# Patient Record
Sex: Female | Born: 1965 | Race: White | Hispanic: No | Marital: Married | Smoking: Never smoker
Health system: Southern US, Community
[De-identification: ages and names within clinical notes are randomized; demographics above are authoritative.]

## PROBLEM LIST (undated history)

## (undated) DIAGNOSIS — Z9189 Other specified personal risk factors, not elsewhere classified: Secondary | ICD-10-CM

## (undated) DIAGNOSIS — N183 Chronic kidney disease, stage 3 unspecified: Secondary | ICD-10-CM

## (undated) DIAGNOSIS — N393 Stress incontinence (female) (male): Secondary | ICD-10-CM

## (undated) DIAGNOSIS — F419 Anxiety disorder, unspecified: Secondary | ICD-10-CM

## (undated) DIAGNOSIS — Z1371 Encounter for nonprocreative screening for genetic disease carrier status: Secondary | ICD-10-CM

## (undated) DIAGNOSIS — E559 Vitamin D deficiency, unspecified: Secondary | ICD-10-CM

## (undated) DIAGNOSIS — N979 Female infertility, unspecified: Secondary | ICD-10-CM

## (undated) DIAGNOSIS — L309 Dermatitis, unspecified: Secondary | ICD-10-CM

## (undated) DIAGNOSIS — A6000 Herpesviral infection of urogenital system, unspecified: Secondary | ICD-10-CM

## (undated) DIAGNOSIS — F329 Major depressive disorder, single episode, unspecified: Secondary | ICD-10-CM

## (undated) DIAGNOSIS — Z803 Family history of malignant neoplasm of breast: Secondary | ICD-10-CM

## (undated) DIAGNOSIS — F3281 Premenstrual dysphoric disorder: Secondary | ICD-10-CM

## (undated) DIAGNOSIS — F32A Depression, unspecified: Secondary | ICD-10-CM

## (undated) DIAGNOSIS — B009 Herpesviral infection, unspecified: Secondary | ICD-10-CM

## (undated) HISTORY — DX: Vitamin D deficiency, unspecified: E55.9

## (undated) HISTORY — DX: Herpesviral infection of urogenital system, unspecified: A60.00

## (undated) HISTORY — DX: Anxiety disorder, unspecified: F41.9

## (undated) HISTORY — DX: Chronic kidney disease, stage 3 unspecified: N18.30

## (undated) HISTORY — DX: Encounter for nonprocreative screening for genetic disease carrier status: Z13.71

## (undated) HISTORY — DX: Major depressive disorder, single episode, unspecified: F32.9

## (undated) HISTORY — DX: Dermatitis, unspecified: L30.9

## (undated) HISTORY — PX: DILATION AND CURETTAGE OF UTERUS: SHX78

## (undated) HISTORY — DX: Female infertility, unspecified: N97.9

## (undated) HISTORY — DX: Premenstrual dysphoric disorder: F32.81

## (undated) HISTORY — DX: Depression, unspecified: F32.A

## (undated) HISTORY — DX: Family history of malignant neoplasm of breast: Z80.3

## (undated) HISTORY — DX: Herpesviral infection, unspecified: B00.9

## (undated) HISTORY — DX: Stress incontinence (female) (male): N39.3

## (undated) HISTORY — DX: Other specified personal risk factors, not elsewhere classified: Z91.89

---

## 2001-11-28 ENCOUNTER — Encounter: Admission: RE | Admit: 2001-11-28 | Discharge: 2001-11-28 | Payer: Self-pay | Admitting: Internal Medicine

## 2001-11-28 ENCOUNTER — Encounter: Payer: Self-pay | Admitting: Internal Medicine

## 2005-08-23 ENCOUNTER — Encounter: Admission: RE | Admit: 2005-08-23 | Discharge: 2005-08-23 | Payer: Self-pay | Admitting: Family Medicine

## 2005-10-04 ENCOUNTER — Encounter: Admission: RE | Admit: 2005-10-04 | Discharge: 2005-10-04 | Payer: Self-pay | Admitting: Unknown Physician Specialty

## 2005-11-23 ENCOUNTER — Encounter: Admission: RE | Admit: 2005-11-23 | Discharge: 2005-11-23 | Payer: Self-pay | Admitting: Unknown Physician Specialty

## 2006-05-13 ENCOUNTER — Encounter: Admission: RE | Admit: 2006-05-13 | Discharge: 2006-05-13 | Payer: Self-pay | Admitting: Unknown Physician Specialty

## 2006-10-28 ENCOUNTER — Encounter: Admission: RE | Admit: 2006-10-28 | Discharge: 2006-10-28 | Payer: Self-pay | Admitting: Unknown Physician Specialty

## 2007-12-10 ENCOUNTER — Encounter: Admission: RE | Admit: 2007-12-10 | Discharge: 2007-12-10 | Payer: Self-pay | Admitting: Unknown Physician Specialty

## 2009-02-25 ENCOUNTER — Encounter: Admission: RE | Admit: 2009-02-25 | Discharge: 2009-02-25 | Payer: Self-pay | Admitting: Unknown Physician Specialty

## 2010-02-23 ENCOUNTER — Encounter: Admission: RE | Admit: 2010-02-23 | Discharge: 2010-02-23 | Payer: Self-pay | Admitting: Family Medicine

## 2010-05-15 ENCOUNTER — Encounter: Admission: RE | Admit: 2010-05-15 | Discharge: 2010-05-15 | Payer: Self-pay | Admitting: Unknown Physician Specialty

## 2011-06-04 ENCOUNTER — Other Ambulatory Visit: Payer: Self-pay | Admitting: Unknown Physician Specialty

## 2011-06-04 DIAGNOSIS — Z1231 Encounter for screening mammogram for malignant neoplasm of breast: Secondary | ICD-10-CM

## 2011-06-25 ENCOUNTER — Ambulatory Visit
Admission: RE | Admit: 2011-06-25 | Discharge: 2011-06-25 | Disposition: A | Discharge status: Home / Self Care | Payer: BC Managed Care – PPO | Source: Ambulatory Visit | Attending: Unknown Physician Specialty | Admitting: Unknown Physician Specialty

## 2011-06-25 DIAGNOSIS — Z1231 Encounter for screening mammogram for malignant neoplasm of breast: Secondary | ICD-10-CM

## 2012-06-18 ENCOUNTER — Other Ambulatory Visit: Payer: Self-pay | Admitting: Unknown Physician Specialty

## 2012-06-18 DIAGNOSIS — Z1231 Encounter for screening mammogram for malignant neoplasm of breast: Secondary | ICD-10-CM

## 2012-07-28 ENCOUNTER — Ambulatory Visit
Admission: RE | Admit: 2012-07-28 | Discharge: 2012-07-28 | Disposition: A | Discharge status: Home / Self Care | Payer: BC Managed Care – PPO | Source: Ambulatory Visit | Attending: Unknown Physician Specialty | Admitting: Unknown Physician Specialty

## 2012-07-28 DIAGNOSIS — Z1231 Encounter for screening mammogram for malignant neoplasm of breast: Secondary | ICD-10-CM

## 2012-10-20 ENCOUNTER — Other Ambulatory Visit: Payer: Self-pay | Admitting: *Deleted

## 2012-10-20 DIAGNOSIS — Z1239 Encounter for other screening for malignant neoplasm of breast: Secondary | ICD-10-CM

## 2012-10-21 ENCOUNTER — Ambulatory Visit: Payer: Self-pay | Admitting: Oncology

## 2012-11-18 ENCOUNTER — Telehealth: Payer: Self-pay | Admitting: Oncology

## 2012-11-18 NOTE — Telephone Encounter (Signed)
/  S/w pt in re to [facility] appt 07/16 @ 11 w/Dr. Welton Flakes.

## 2012-12-19 ENCOUNTER — Ambulatory Visit
Admission: RE | Admit: 2012-12-19 | Discharge: 2012-12-19 | Disposition: A | Discharge status: Home / Self Care | Payer: BC Managed Care – PPO | Source: Ambulatory Visit | Attending: *Deleted | Admitting: *Deleted

## 2012-12-19 DIAGNOSIS — Z1239 Encounter for other screening for malignant neoplasm of breast: Secondary | ICD-10-CM

## 2012-12-19 MED ORDER — GADOBENATE DIMEGLUMINE 529 MG/ML IV SOLN
13.0000 mL | Freq: Once | INTRAVENOUS | Status: AC | PRN
  Administered 2012-12-19: 13 mL via INTRAVENOUS

## 2013-02-11 ENCOUNTER — Encounter: Payer: BC Managed Care – PPO | Admitting: Oncology

## 2013-05-07 ENCOUNTER — Other Ambulatory Visit: Payer: Self-pay | Admitting: Internal Medicine

## 2013-05-07 DIAGNOSIS — E079 Disorder of thyroid, unspecified: Secondary | ICD-10-CM

## 2013-05-13 ENCOUNTER — Ambulatory Visit
Admission: RE | Admit: 2013-05-13 | Discharge: 2013-05-13 | Disposition: A | Discharge status: Home / Self Care | Payer: BC Managed Care – PPO | Source: Ambulatory Visit | Attending: Internal Medicine | Admitting: Internal Medicine

## 2013-05-13 DIAGNOSIS — E079 Disorder of thyroid, unspecified: Secondary | ICD-10-CM

## 2013-08-12 ENCOUNTER — Other Ambulatory Visit: Payer: Self-pay

## 2013-08-12 DIAGNOSIS — Z1231 Encounter for screening mammogram for malignant neoplasm of breast: Secondary | ICD-10-CM

## 2013-09-04 ENCOUNTER — Ambulatory Visit
Admission: RE | Admit: 2013-09-04 | Discharge: 2013-09-04 | Disposition: A | Discharge status: Home / Self Care | Payer: Self-pay | Source: Ambulatory Visit

## 2013-09-04 DIAGNOSIS — Z1231 Encounter for screening mammogram for malignant neoplasm of breast: Secondary | ICD-10-CM

## 2016-05-18 ENCOUNTER — Other Ambulatory Visit: Payer: Self-pay | Admitting: Obstetrics and Gynecology

## 2016-05-18 DIAGNOSIS — N63 Unspecified lump in unspecified breast: Secondary | ICD-10-CM

## 2016-05-24 ENCOUNTER — Ambulatory Visit
Admission: RE | Admit: 2016-05-24 | Discharge: 2016-05-24 | Disposition: A | Discharge status: Home / Self Care | Payer: Self-pay | Source: Ambulatory Visit | Attending: Obstetrics and Gynecology | Admitting: Obstetrics and Gynecology

## 2016-05-24 ENCOUNTER — Other Ambulatory Visit: Payer: Self-pay

## 2016-05-24 DIAGNOSIS — N63 Unspecified lump in unspecified breast: Secondary | ICD-10-CM

## 2016-06-07 ENCOUNTER — Encounter: Payer: Self-pay | Admitting: Gastroenterology

## 2016-06-29 DIAGNOSIS — Z1371 Encounter for nonprocreative screening for genetic disease carrier status: Secondary | ICD-10-CM

## 2016-06-29 DIAGNOSIS — Z9189 Other specified personal risk factors, not elsewhere classified: Secondary | ICD-10-CM

## 2016-06-29 HISTORY — DX: Encounter for nonprocreative screening for genetic disease carrier status: Z13.71

## 2016-06-29 HISTORY — DX: Other specified personal risk factors, not elsewhere classified: Z91.89

## 2016-07-12 ENCOUNTER — Ambulatory Visit (AMBULATORY_SURGERY_CENTER): Payer: Self-pay | Admitting: *Deleted

## 2016-07-12 VITALS — Ht 63.0 in | Wt 162.0 lb

## 2016-07-12 DIAGNOSIS — Z1211 Encounter for screening for malignant neoplasm of colon: Secondary | ICD-10-CM

## 2016-07-12 MED ORDER — NA SULFATE-K SULFATE-MG SULF 17.5-3.13-1.6 GM/177ML PO SOLN: 1.0000 | Freq: Once | ORAL | 0 refills | Status: AC

## 2016-07-12 NOTE — Progress Notes (Signed)
No egg or soy allergy known to patient  No issues with past sedation with any surgeries  or procedures, no intubation problems  No diet pills per patient No home 02 use per patient  No blood thinners per patient  Pt denies issues with constipation  No A fib or A flutter   emmi declined'   

## 2016-07-13 ENCOUNTER — Encounter: Payer: Self-pay | Admitting: Gastroenterology

## 2016-07-25 ENCOUNTER — Encounter: Payer: 59 | Admitting: Gastroenterology

## 2016-08-22 DIAGNOSIS — D225 Melanocytic nevi of trunk: Secondary | ICD-10-CM | POA: Diagnosis not present

## 2016-08-22 DIAGNOSIS — L821 Other seborrheic keratosis: Secondary | ICD-10-CM | POA: Diagnosis not present

## 2016-08-22 DIAGNOSIS — D2371 Other benign neoplasm of skin of right lower limb, including hip: Secondary | ICD-10-CM | POA: Diagnosis not present

## 2016-09-03 ENCOUNTER — Ambulatory Visit (AMBULATORY_SURGERY_CENTER): Payer: 59 | Admitting: Gastroenterology

## 2016-09-03 ENCOUNTER — Encounter: Payer: Self-pay | Admitting: Gastroenterology

## 2016-09-03 VITALS — BP 108/64 | HR 64 | Temp 98.9°F | Resp 16 | Ht 63.0 in | Wt 162.0 lb

## 2016-09-03 DIAGNOSIS — Z1211 Encounter for screening for malignant neoplasm of colon: Secondary | ICD-10-CM

## 2016-09-03 DIAGNOSIS — K635 Polyp of colon: Secondary | ICD-10-CM

## 2016-09-03 DIAGNOSIS — D120 Benign neoplasm of cecum: Secondary | ICD-10-CM

## 2016-09-03 DIAGNOSIS — Z1212 Encounter for screening for malignant neoplasm of rectum: Secondary | ICD-10-CM | POA: Diagnosis not present

## 2016-09-03 MED ORDER — SODIUM CHLORIDE 0.9 % IV SOLN: 500.0000 mL | INTRAVENOUS | Status: AC

## 2016-09-03 NOTE — Progress Notes (Signed)
Called to room to assist during endoscopic procedure.  Patient ID and intended procedure confirmed with present staff. Received instructions for my participation in the procedure from the performing physician.  

## 2016-09-03 NOTE — Progress Notes (Signed)
Report to PACU, RN, vss, BBS= Clear.  

## 2016-09-03 NOTE — Patient Instructions (Signed)
YOU HAD AN ENDOSCOPIC PROCEDURE TODAY AT [facility]:   Refer to the procedure report that was given to you for any specific questions about what was found during the examination.  If the procedure report does not answer your questions, please call your gastroenterologist to clarify.  If you requested that your care partner not be given the details of your procedure findings, then the procedure report has been included in a sealed envelope for you to review at your convenience later.  YOU SHOULD EXPECT: Some feelings of bloating in the abdomen. Passage of more gas than usual.  Walking can help get rid of the air that was put into your GI tract during the procedure and reduce the bloating. If you had a lower endoscopy (such as a colonoscopy or flexible sigmoidoscopy) you may notice spotting of blood in your stool or on the toilet paper. If you underwent a bowel prep for your procedure, you may not have a normal bowel movement for a few days.  Please Note:  You might notice some irritation and congestion in your nose or some drainage.  This is from the oxygen used during your procedure.  There is no need for concern and it should clear up in a day or so.  SYMPTOMS TO REPORT IMMEDIATELY:   Following lower endoscopy (colonoscopy or flexible sigmoidoscopy):  Excessive amounts of blood in the stool  Significant tenderness or worsening of abdominal pains  Swelling of the abdomen that is new, acute  Fever of 100F or higher  For urgent or emergent issues, a gastroenterologist can be reached at any hour by calling [phone].   DIET:  We do recommend a small meal at first, but then you may proceed to your regular diet.  Drink plenty of fluids but you should avoid alcoholic beverages for 24 hours.  ACTIVITY:  You should plan to take it easy for the rest of today and you should NOT DRIVE or use heavy machinery until tomorrow (because of the sedation medicines used during the test).     FOLLOW UP: Our staff will call the number listed on your records the next business day following your procedure to check on you and address any questions or concerns that you may have regarding the information given to you following your procedure. If we do not reach you, we will leave a message.  However, if you are feeling well and you are not experiencing any problems, there is no need to return our call.  We will assume that you have returned to your regular daily activities without incident.  If any biopsies were taken you will be contacted by phone or by letter within the next 1-3 weeks.  Please call us at [phone] if you have not heard about the biopsies in 3 weeks.   Await biopsy results to determined next repeat Colonoscopy( 5-10 years repeat Colonoscopy depending on biopsy results) Polyps (handout given)   SIGNATURES/CONFIDENTIALITY: You and/or your care partner have signed paperwork which will be entered into your electronic medical record.  These signatures attest to the fact that that the information above on your After Visit Summary has been reviewed and is understood.  Full responsibility of the confidentiality of this discharge information lies with you and/or your care-partner.

## 2016-09-03 NOTE — Progress Notes (Signed)
Pt's states no medical or surgical changes since previsit or office visit. 

## 2016-09-03 NOTE — Op Note (Signed)
[facility] Patient Name: Betty Ross Procedure Date: 09/03/2016 9:37 AM MRN: [id number] Endoscopist: Mauri Pole , MD Age: 51 Referring MD:  Date of Birth: 1966-04-18 Gender: Female Account #: [id number] Procedure:                Colonoscopy Indications:              Screening for colorectal malignant neoplasm Medicines:                Monitored Anesthesia Care Procedure:                Pre-Anesthesia Assessment:                           - Prior to the procedure, a History and Physical                            was performed, and patient medications and                            allergies were reviewed. The patient's tolerance of                            previous anesthesia was also reviewed. The risks                            and benefits of the procedure and the sedation                            options and risks were discussed with the patient.                            All questions were answered, and informed consent                            was obtained. Prior Anticoagulants: The patient has                            taken no previous anticoagulant or antiplatelet                            agents. ASA Grade Assessment: II - A patient with                            mild systemic disease. After reviewing the risks                            and benefits, the patient was deemed in                            satisfactory condition to undergo the procedure.                           After obtaining informed consent, the colonoscope  was passed under direct vision. Throughout the                            procedure, the patient's blood pressure, pulse, and                            oxygen saturations were monitored continuously. The                            Model CF-HQ190L [id number]) scope was introduced                            through the anus and advanced to the the terminal                            ileum, with  identification of the appendiceal                            orifice and IC valve. The colonoscopy was performed                            without difficulty. The patient tolerated the                            procedure well. The quality of the bowel                            preparation was good. The terminal ileum, ileocecal                            valve, appendiceal orifice, and rectum were                            photographed. Scope In: 9:40:04 AM Scope Out: 9:55:09 AM Scope Withdrawal Time: 0 hours 11 minutes 36 seconds  Total Procedure Duration: 0 hours 15 minutes 5 seconds  Findings:                 The perianal and digital rectal examinations were                            normal.                           A 2 mm polyp was found in the cecum. The polyp was                            sessile. The polyp was removed with a cold biopsy                            forceps. Resection and retrieval were complete.                           The exam was otherwise without abnormality. Complications:  No immediate complications. Estimated Blood Loss:     Estimated blood loss was minimal. Impression:               - One 2 mm polyp in the cecum, removed with a cold                            biopsy forceps. Resected and retrieved.                           - The examination was otherwise normal. Recommendation:           - Patient has a contact number available for                            emergencies. The signs and symptoms of potential                            delayed complications were discussed with the                            patient. Return to normal activities tomorrow.                            Written discharge instructions were provided to the                            patient.                           - Resume previous diet.                           - Continue present medications.                           - Await pathology results.                            - Repeat colonoscopy in 5-10 years for surveillance                            based on pathology results. Mauri Pole, MD 09/03/2016 10:02:29 AM This report has been signed electronically.

## 2016-09-04 ENCOUNTER — Telehealth: Payer: Self-pay

## 2016-09-04 NOTE — Telephone Encounter (Signed)
  Follow up Call-  Call back number 09/03/2016  Post procedure Call Back phone  # [phone]  Permission to leave phone message Yes  Some recent data might be hidden     Patient questions:  Do you have a fever, pain , or abdominal swelling? No. Pain Score  0 *  Have you tolerated food without any problems? Yes.    Have you been able to return to your normal activities? Yes.    Do you have any questions about your discharge instructions: Diet   No. Medications  No. Follow up visit  No.  Do you have questions or concerns about your Care? No.  Actions: * If pain score is 4 or above: No action needed, pain <4.

## 2016-09-21 ENCOUNTER — Encounter: Payer: Self-pay | Admitting: Gastroenterology

## 2016-11-28 ENCOUNTER — Telehealth: Payer: Self-pay

## 2016-11-28 DIAGNOSIS — A6009 Herpesviral infection of other urogenital tract: Secondary | ICD-10-CM

## 2016-11-28 MED ORDER — VALACYCLOVIR HCL 500 MG PO TABS: 500.0000 mg | ORAL_TABLET | Freq: Two times a day (BID) | ORAL | 5 refills | Status: DC

## 2016-11-28 NOTE — Telephone Encounter (Signed)
   This encounter was created in error - please disregard.  Rx called in to pharm.  Pt aware.

## 2016-11-28 NOTE — Telephone Encounter (Signed)
This encounter was created in error - please disregard.

## 2016-11-28 NOTE — Telephone Encounter (Signed)
Pt calling.  She contacted her [facility] in [place], - for refill but it was too old to refill.  She thinks she has had an annula this year.  Needs refill for valtrex.  [phone]

## 2016-11-28 NOTE — Telephone Encounter (Signed)
Pt called for refill of valtrex.  Refill called in as I wasn't able to get order to go thru [facility].

## 2016-12-05 ENCOUNTER — Telehealth: Payer: Self-pay

## 2016-12-05 NOTE — Telephone Encounter (Signed)
Pt left msg stating that rx for valtrex had not been sent in. Per last telephone note rx called in via phone to [facility] on [address]. Called pharmacy to verify they received and rx was put on hold. Left msg for pt to contact pharmacy to have them fill rx.

## 2017-03-19 DIAGNOSIS — M25511 Pain in right shoulder: Secondary | ICD-10-CM | POA: Diagnosis not present

## 2017-03-19 DIAGNOSIS — M7541 Impingement syndrome of right shoulder: Secondary | ICD-10-CM | POA: Diagnosis not present

## 2017-03-30 ENCOUNTER — Other Ambulatory Visit: Payer: Self-pay | Admitting: Obstetrics and Gynecology

## 2017-03-30 DIAGNOSIS — A6009 Herpesviral infection of other urogenital tract: Secondary | ICD-10-CM

## 2017-04-25 ENCOUNTER — Ambulatory Visit (INDEPENDENT_AMBULATORY_CARE_PROVIDER_SITE_OTHER): Payer: 59 | Admitting: Obstetrics and Gynecology

## 2017-04-25 ENCOUNTER — Encounter: Payer: Self-pay | Admitting: Obstetrics and Gynecology

## 2017-04-25 VITALS — BP 120/80 | HR 58 | Ht 63.0 in | Wt 149.0 lb

## 2017-04-25 DIAGNOSIS — Z9189 Other specified personal risk factors, not elsewhere classified: Secondary | ICD-10-CM

## 2017-04-25 DIAGNOSIS — Z803 Family history of malignant neoplasm of breast: Secondary | ICD-10-CM | POA: Diagnosis not present

## 2017-04-25 DIAGNOSIS — N760 Acute vaginitis: Secondary | ICD-10-CM | POA: Diagnosis not present

## 2017-04-25 LAB — POCT WET PREP WITH KOH
Clue Cells Wet Prep HPF POC: NEGATIVE
KOH PREP POC: NEGATIVE
TRICHOMONAS UA: NEGATIVE
Yeast Wet Prep HPF POC: NEGATIVE

## 2017-04-25 MED ORDER — CLOTRIMAZOLE-BETAMETHASONE 1-0.05 % EX CREA: 1.0000 "application " | TOPICAL_CREAM | Freq: Two times a day (BID) | CUTANEOUS | 0 refills | Status: DC

## 2017-04-25 NOTE — Progress Notes (Signed)
Chief Complaint  Patient presents with  . Vaginitis    HPI:      [name] is a 51 y.o. [id number] who LMP was Patient's last menstrual period was 03/30/2017., presents today for vaginal itching/irritation and cuts for the fast few months. She has  had the same sx that come and go, usually before her periods, for a few yrs, but sx are more frequent recently. She has treated with valtrex with relief in the past but that isn't working anymore. She has an increased d/c, no odor. She uses dove soap/no dryer sheets/unscented pantyliners. No ext meds to treat.   She is also past due for [facility] testing results done 12/17 at annual.  Results are negative for a cancer genetic mutation, EXCEPT 2 VUS in MSH6 and RAD51C.  IBIS=33.8 % Riskscore=47.4 % Last mammogram 10/17 Vitamin D status: TAKES 3000 IU daily  Annual due 12/18.   Past Medical History:  Diagnosis Date  . BRCA negative 06/2016   MyRisk neg, MSH6 and RAD51C VUS  . Depression   . Family history of breast cancer   . Increased risk of breast cancer 06/2016   IBIS=33%/riskscore=47.4%  . Vitamin D deficiency     Past Surgical History:  Procedure Laterality Date  . DILATION AND CURETTAGE OF UTERUS    . VAGINAL DELIVERY     x2    Family History  Problem Relation Age of Onset  . Colon polyps Father   . Kidney cancer Father 86  . Breast cancer Mother 5  . Breast cancer Sister 24       mat 1/2 sister, ? BRCA neg  . Breast cancer Paternal Aunt 45  . Colon cancer Neg Hx   . Esophageal cancer Neg Hx   . Rectal cancer Neg Hx   . Stomach cancer Neg Hx     Social History   Social History  . Marital status: Married    Spouse name: N/A  . Number of children: N/A  . Years of education: N/A   Occupational History  . Not on file.   Social History Main Topics  . Smoking status: Never Smoker  . Smokeless tobacco: Never Used  . Alcohol use No  . Drug use: No  . Sexual activity: Yes    Birth control/ protection:  None   Other Topics Concern  . Not on file   Social History Narrative  . No narrative on file     Current Outpatient Prescriptions:  .  buPROPion (WELLBUTRIN SR) 200 MG 12 hr tablet, Take 200 mg by mouth daily., Disp: , Rfl:  .  cholecalciferol (VITAMIN D) 1000 units tablet, Take 1,000 Units by mouth daily., Disp: , Rfl:  .  clotrimazole-betamethasone (LOTRISONE) cream, Apply 1 application topically 2 (two) times daily. Apply externally BID prn sx up to 2 wks, Disp: 15 g, Rfl: 0 .  valACYclovir (VALTREX) 500 MG tablet, TAKE 1 TABLET BY MOUTH TWICE DAILY FOR 3 DAYS AS NEEDED FOR SYMPTOMS, Disp: 30 tablet, Rfl: 0  Current Facility-Administered Medications:  .  0.9 %  sodium chloride infusion, 500 mL, Intravenous, Continuous, Nandigam, Kavitha V, MD   ROS:  Review of Systems  Constitutional: Negative for fever.  Gastrointestinal: Negative for blood in stool, constipation, diarrhea, nausea and vomiting.  Genitourinary: Positive for vaginal discharge. Negative for dyspareunia, dysuria, flank pain, frequency, hematuria, urgency, vaginal bleeding and vaginal pain.  Musculoskeletal: Negative for back pain.  Skin: Negative for rash.  OBJECTIVE:   Vitals:  BP 120/80   Pulse (!) 58   Ht '5\' 3"'  (1.6 m)   Wt 149 lb (67.6 kg)   LMP 03/30/2017   BMI 26.39 kg/m   Physical Exam  Constitutional: She is oriented to person, place, and time and well-developed, well-nourished, and in no distress. Vital signs are normal.  Genitourinary: Vagina normal, uterus normal, cervix normal, right adnexa normal and left adnexa normal. Uterus is not enlarged. Cervix exhibits no motion tenderness and no tenderness. Right adnexum displays no mass and no tenderness. Left adnexum displays no mass and no tenderness. Vulva exhibits erythema. Vulva exhibits no exudate, no lesion, no rash and no tenderness. Vagina exhibits no lesion.  Genitourinary Comments: EXT LABIA MAJORA WITH BILAT ERYTHEMA/SCALE; FEW  FISSURES RT LABIA MINORA/MAJORA JXN; NO ULCERATIVE LESIONS  Neurological: She is oriented to person, place, and time.  Vitals reviewed.   Results: Results for orders placed or performed in visit on 04/25/17 (from the past 24 hour(s))  POCT Wet Prep with KOH     Status: Normal   Collection Time: 04/25/17 12:01 PM  Result Value Ref Range   Trichomonas, UA Negative    Clue Cells Wet Prep HPF POC NEG    Epithelial Wet Prep HPF POC  Few, Moderate, Many, Too numerous to count   Yeast Wet Prep HPF POC NEG    Bacteria Wet Prep HPF POC  Few   RBC Wet Prep HPF POC     WBC Wet Prep HPF POC     KOH Prep POC Negative Negative     Assessment/Plan: Acute vaginitis - Neg wet prep/pos exam. Rx lotrisone crm. F/u prn.  - Plan: clotrimazole-betamethasone (LOTRISONE) cream, POCT Wet Prep with KOH  Family history of breast cancer - MyRisk neg.   Increased risk of breast cancer - Riskscore=47%.  Recommended monthly SBE, Q6-12 mos CBE, yearly mammos and add Vit D3 3000 IU dialy. Also offered yearly screening breast MRI, staggered with mammos. Pt to do mammo first and f/u by 4/19 if wants MRI.   Patient understands these results only apply to her and her children, and this is not indicative of genetic testing results of her other family members. It is recommended that her other family members have genetic testing done.  Pt also understands negative genetic testing doesn't mean she will never get any of these cancers.   Results handout given to pt.    Meds ordered this encounter  Medications  . clotrimazole-betamethasone (LOTRISONE) cream    Sig: Apply 1 application topically 2 (two) times daily. Apply externally BID prn sx up to 2 wks    Dispense:  15 g    Refill:  0      Return if symptoms worsen or fail to improve, for 11/03 annual.  Rondey Fallen B. Corben Auzenne, PA-C 04/25/2017 12:09 PM

## 2017-05-03 DIAGNOSIS — Z23 Encounter for immunization: Secondary | ICD-10-CM | POA: Diagnosis not present

## 2017-06-04 DIAGNOSIS — R82998 Other abnormal findings in urine: Secondary | ICD-10-CM | POA: Diagnosis not present

## 2017-06-04 DIAGNOSIS — Z0000 Encounter for general adult medical examination without abnormal findings: Secondary | ICD-10-CM | POA: Diagnosis not present

## 2017-06-11 ENCOUNTER — Other Ambulatory Visit: Payer: Self-pay | Admitting: Obstetrics and Gynecology

## 2017-06-11 DIAGNOSIS — Z803 Family history of malignant neoplasm of breast: Secondary | ICD-10-CM | POA: Diagnosis not present

## 2017-06-11 DIAGNOSIS — Z0000 Encounter for general adult medical examination without abnormal findings: Secondary | ICD-10-CM | POA: Diagnosis not present

## 2017-06-11 DIAGNOSIS — E559 Vitamin D deficiency, unspecified: Secondary | ICD-10-CM | POA: Diagnosis not present

## 2017-06-11 DIAGNOSIS — Z1231 Encounter for screening mammogram for malignant neoplasm of breast: Secondary | ICD-10-CM

## 2017-06-13 DIAGNOSIS — Z1212 Encounter for screening for malignant neoplasm of rectum: Secondary | ICD-10-CM | POA: Diagnosis not present

## 2017-07-10 ENCOUNTER — Ambulatory Visit: Payer: 59

## 2017-07-11 ENCOUNTER — Encounter: Payer: Self-pay | Admitting: Obstetrics and Gynecology

## 2017-07-11 ENCOUNTER — Ambulatory Visit
Admission: RE | Admit: 2017-07-11 | Discharge: 2017-07-11 | Disposition: A | Discharge status: Home / Self Care | Payer: 59 | Source: Ambulatory Visit | Attending: Obstetrics and Gynecology | Admitting: Obstetrics and Gynecology

## 2017-07-11 DIAGNOSIS — Z1231 Encounter for screening mammogram for malignant neoplasm of breast: Secondary | ICD-10-CM | POA: Diagnosis not present

## 2017-08-22 DIAGNOSIS — D2272 Melanocytic nevi of left lower limb, including hip: Secondary | ICD-10-CM | POA: Diagnosis not present

## 2017-08-22 DIAGNOSIS — D2271 Melanocytic nevi of right lower limb, including hip: Secondary | ICD-10-CM | POA: Diagnosis not present

## 2017-08-22 DIAGNOSIS — L570 Actinic keratosis: Secondary | ICD-10-CM | POA: Diagnosis not present

## 2017-08-22 DIAGNOSIS — D225 Melanocytic nevi of trunk: Secondary | ICD-10-CM | POA: Diagnosis not present

## 2017-10-21 ENCOUNTER — Ambulatory Visit (INDEPENDENT_AMBULATORY_CARE_PROVIDER_SITE_OTHER): Payer: 59 | Admitting: Obstetrics and Gynecology

## 2017-10-21 ENCOUNTER — Encounter: Payer: Self-pay | Admitting: Obstetrics and Gynecology

## 2017-10-21 VITALS — BP 112/68 | HR 70 | Ht 63.0 in | Wt 150.0 lb

## 2017-10-21 DIAGNOSIS — F329 Major depressive disorder, single episode, unspecified: Secondary | ICD-10-CM

## 2017-10-21 DIAGNOSIS — Z803 Family history of malignant neoplasm of breast: Secondary | ICD-10-CM | POA: Diagnosis not present

## 2017-10-21 DIAGNOSIS — F32A Depression, unspecified: Secondary | ICD-10-CM

## 2017-10-21 DIAGNOSIS — F3281 Premenstrual dysphoric disorder: Secondary | ICD-10-CM | POA: Insufficient documentation

## 2017-10-21 DIAGNOSIS — Z01419 Encounter for gynecological examination (general) (routine) without abnormal findings: Secondary | ICD-10-CM

## 2017-10-21 DIAGNOSIS — Z1239 Encounter for other screening for malignant neoplasm of breast: Secondary | ICD-10-CM

## 2017-10-21 DIAGNOSIS — Z9189 Other specified personal risk factors, not elsewhere classified: Secondary | ICD-10-CM

## 2017-10-21 DIAGNOSIS — Z1231 Encounter for screening mammogram for malignant neoplasm of breast: Secondary | ICD-10-CM

## 2017-10-21 DIAGNOSIS — A6004 Herpesviral vulvovaginitis: Secondary | ICD-10-CM | POA: Diagnosis not present

## 2017-10-21 DIAGNOSIS — Z1151 Encounter for screening for human papillomavirus (HPV): Secondary | ICD-10-CM | POA: Diagnosis not present

## 2017-10-21 DIAGNOSIS — N393 Stress incontinence (female) (male): Secondary | ICD-10-CM | POA: Insufficient documentation

## 2017-10-21 DIAGNOSIS — Z124 Encounter for screening for malignant neoplasm of cervix: Secondary | ICD-10-CM | POA: Diagnosis not present

## 2017-10-21 DIAGNOSIS — F419 Anxiety disorder, unspecified: Secondary | ICD-10-CM

## 2017-10-21 DIAGNOSIS — B009 Herpesviral infection, unspecified: Secondary | ICD-10-CM | POA: Insufficient documentation

## 2017-10-21 MED ORDER — SERTRALINE HCL 50 MG PO TABS: 50.0000 mg | ORAL_TABLET | Freq: Every day | ORAL | 1 refills | Status: DC

## 2017-10-21 NOTE — Progress Notes (Signed)
PCP: [name], MD   Chief Complaint  Patient presents with  . Gynecologic Exam    HPI:      [name] is a 52 y.o. [id number] who LMP was No LMP recorded. Patient is perimenopausal., presents today for her annual examination.  Her menses are irregular with perimenopause, lasting 3 days.  Dysmenorrhea none. She does not have intermenstrual bleeding. She does have vasomotor sx, especially night sweats. Sx worse if under stress.   Sex activity: single partner, contraception - vasectomy. She does not have vaginal dryness.  Last Pap: March 01, 2015  Results were: no abnormalities /neg HPV DNA.  Hx of STDs: HSV, takes valtrex prn sx; doesn't need Rx RF currently  Last mammogram: July 11, 2017  Results were: normal--routine follow-up in 12 months There is a FH of breast cancer in her mom, mat 1/2 sister and pat aunt. There is no FH of ovarian cancer. The patient does do self-breast exams. Pt is MyRisk neg 2017. IBIS=33%/riskscore=47%. She has not had a scr breast MRI. She takes Vit D supp. She had a Q6 mo CBE 9/18.  Colonoscopy: colonoscopy 1 years ago without abnormalities.  Repeat due after 10 years.   Tobacco use: The patient denies current or previous tobacco use. Alcohol use: none Exercise: very active  She does get adequate calcium and Vitamin D in her diet.  Labs with PCP.  She is on wellbutrin for depression sx with PCP. She has noted increased anxiety sx of feeling overwhelmed, difficulty sleeping, not being able to relax, difficulty concentrating. 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 Sx are at least once daily. Has not tried any meds. She exercises daily to help.   Past Medical History:  Diagnosis Date  . Anxiety   . BRCA negative 06/2016   MyRisk neg, MSH6 and RAD51C VUS  . Depression   . Eczema   . Family history of breast cancer   . Herpes   . Increased risk of breast cancer 06/2016   IBIS=33%/riskscore=47.4%  . Infertility, female   . PMDD (premenstrual dysphoric disorder)   . Stress incontinence   . Vitamin D deficiency     Past Surgical History:  Procedure Laterality Date  . DILATION AND CURETTAGE OF UTERUS    . VAGINAL DELIVERY     x2    Family History  Problem Relation Age of Onset  . Colon polyps Father   . Kidney cancer Father 54  . Breast cancer Mother 37  . Osteoporosis Mother   . Breast cancer Sister 5       mat 1/2 sister, ? BRCA neg  . Breast cancer Paternal Aunt 86  . Colon cancer Neg Hx   . Esophageal cancer Neg Hx   . Rectal cancer Neg Hx   . Stomach cancer Neg Hx     Social History   Socioeconomic History  . Marital status: Married    Spouse name: Not on file  . Number of children: Not on file  . Years of education: Not on file  . Highest education level: Not on file  Occupational History  . Not on file  Social Needs  . Financial resource strain: Not on file  . Food insecurity:    Worry: Not on file    Inability: Not on file  . Transportation needs:    Medical: Not on file    Non-medical: Not on file  Tobacco Use  . Smoking status: Never Smoker  . Smokeless tobacco: Never Used  Substance and Sexual Activity  . Alcohol use: No  . Drug use: No  . Sexual activity: Yes    Birth control/protection: None  Lifestyle  . Physical activity:    Days per week: Not on file    Minutes per session: Not on file  . Stress: Not on file  Relationships  . Social connections:    Talks on phone: Not on file    Gets together: Not on file    Attends religious service: Not on file    Active member of club or organization: Not on file    Attends meetings of clubs or organizations: Not on file    Relationship status: Not on file  . Intimate partner violence:    Fear of current or ex partner: Not on file    Emotionally abused: Not on file    Physically abused: Not on file    Forced sexual activity: Not on file  Other Topics Concern  .  Not on file  Social History Narrative  . Not on file    Outpatient Medications Prior to Visit  Medication Sig Dispense Refill  . buPROPion (WELLBUTRIN SR) 200 MG 12 hr tablet Take 200 mg by mouth daily.    . cholecalciferol (VITAMIN D) 1000 units tablet Take 1,000 Units by mouth daily.    . valACYclovir (VALTREX) 500 MG tablet TAKE 1 TABLET BY MOUTH TWICE DAILY FOR 3 DAYS AS NEEDED FOR SYMPTOMS 30 tablet 0  . buPROPion (WELLBUTRIN XL) 300 MG 24 hr tablet TK 1 T PO ONCE D  1  . clotrimazole-betamethasone (LOTRISONE) cream Apply 1 application topically 2 (two) times daily. Apply externally BID prn sx up to 2 wks (Patient not taking: Reported on 10/21/2017) 15 g 0   Facility-Administered Medications Prior to Visit  Medication Dose Route Frequency Provider Last Rate Last Dose  . 0.9 %  sodium chloride infusion  500 mL Intravenous Continuous Nandigam, Kavitha V, MD       ROS:  Review of Systems  Constitutional: Negative for fatigue, fever and unexpected weight change.  Respiratory: Negative for cough, shortness of breath and wheezing.   Cardiovascular: Negative for chest pain, palpitations and leg swelling.  Gastrointestinal: Negative for blood in stool, constipation, diarrhea, nausea and vomiting.  Endocrine: Negative for cold intolerance, heat intolerance and polyuria.  Genitourinary: Negative for dyspareunia, dysuria, flank pain, frequency, genital sores, hematuria, menstrual problem, pelvic pain, urgency, vaginal bleeding, vaginal discharge and vaginal pain.  Musculoskeletal: Negative for back pain, joint swelling and myalgias.  Skin: Negative for rash.  Neurological: Negative for dizziness, syncope, light-headedness, numbness and headaches.  Hematological: Negative for adenopathy.  Psychiatric/Behavioral: Positive for agitation and dysphoric mood. Negative for confusion, sleep disturbance and suicidal ideas. The patient is not nervous/anxious.    BREAST: No symptoms   Objective: BP  112/68   Pulse 70   Ht '5\' 3"'$  (1.6 m)   Wt 150 lb (68 kg)   BMI 26.57 kg/m    Physical Exam  Constitutional: She is oriented to person, place, and time. She appears well-developed and well-nourished.  Genitourinary: Vagina normal and uterus normal. There is no rash or tenderness on the right labia. There is no rash or tenderness on the left labia. No erythema or tenderness in the vagina. No vaginal discharge found. Right adnexum does not display mass and does not display tenderness. Left adnexum does not display mass and does  not display tenderness. Cervix does not exhibit motion tenderness or polyp. Uterus is not enlarged or tender.  Neck: Normal range of motion. No thyromegaly present.  Cardiovascular: Normal rate, regular rhythm and normal heart sounds.  No murmur heard. Pulmonary/Chest: Effort normal and breath sounds normal. Right breast exhibits no mass, no nipple discharge, no skin change and no tenderness. Left breast exhibits no mass, no nipple discharge, no skin change and no tenderness.  Abdominal: Soft. There is no tenderness. There is no guarding.  Musculoskeletal: Normal range of motion.  Neurological: She is alert and oriented to person, place, and time. No cranial nerve deficit.  Psychiatric: She has a normal mood and affect. Her behavior is normal.  Vitals reviewed.  RESULTS:   GAD-7=15 PHQ-9=10  Assessment/Plan:  Encounter for annual routine gynecological examination  Cervical cancer screening - Plan: IGP, Aptima HPV  Screening for HPV (human papillomavirus) - Plan: IGP, Aptima HPV  Screening for breast cancer - Pt to sched mammo 12/19.  - Plan: MM SCREENING BREAST TOMO BILATERAL  Family history of breast cancer - Plan: MM SCREENING BREAST TOMO BILATERAL  Increased risk of breast cancer - Pt aware of monthly SBE, Q6-12 mos CBE, yearly mammos and scr br MRI. Declines MRI for now. Cont Vit D supp. May RTO 9/19 for CBE. - Plan: MM SCREENING BREAST TOMO  BILATERAL  Herpes simplex vulvovaginitis - Will call for valtrex RF prn.  Anxiety and depression - Pt on wellbutrin. Start zoloft after tax season over for anxiety sx. F/u in 9 wks (after being on it for 6 wks). Once stable, can have PCP follow Rx.  - Plan: sertraline (ZOLOFT) 50 MG tablet   Meds ordered this encounter  Medications  . sertraline (ZOLOFT) 50 MG tablet    Sig: Take 1 tablet (50 mg total) by mouth daily. Take 1/2 tab daily for 6 days, then 1 tab daily    Dispense:  30 tablet    Refill:  1    Order Specific Question:   Supervising Provider    Answer:   Gae Dry [[id number]]           GYN counsel breast self exam, mammography screening, menopause, adequate intake of calcium and vitamin D, diet and exercise    F/U  Return in about 2 months (around 12/23/2017) for depression f/u.  Betty Ross B. Iley Deignan, PA-C 10/21/2017 9:36 AM

## 2017-10-21 NOTE — Patient Instructions (Signed)
I value your feedback and entrusting us with your care. If you get a [facility] patient survey, I would appreciate you taking the time to let us know about your experience today. Thank you! 

## 2017-10-23 LAB — IGP, APTIMA HPV
HPV APTIMA: NEGATIVE
PAP SMEAR COMMENT: 0

## 2018-05-17 IMAGING — MG 2D DIGITAL SCREENING BILATERAL MAMMOGRAM WITH CAD AND ADJUNCT TO
9 of 12 series · 9 of 28 positions shown · non-contrast
Comparison: Previous exam(s).

CLINICAL DATA: Screening.

EXAM:
2D DIGITAL SCREENING BILATERAL MAMMOGRAM WITH CAD AND ADJUNCT TOMO

[L MLO synth-2D]
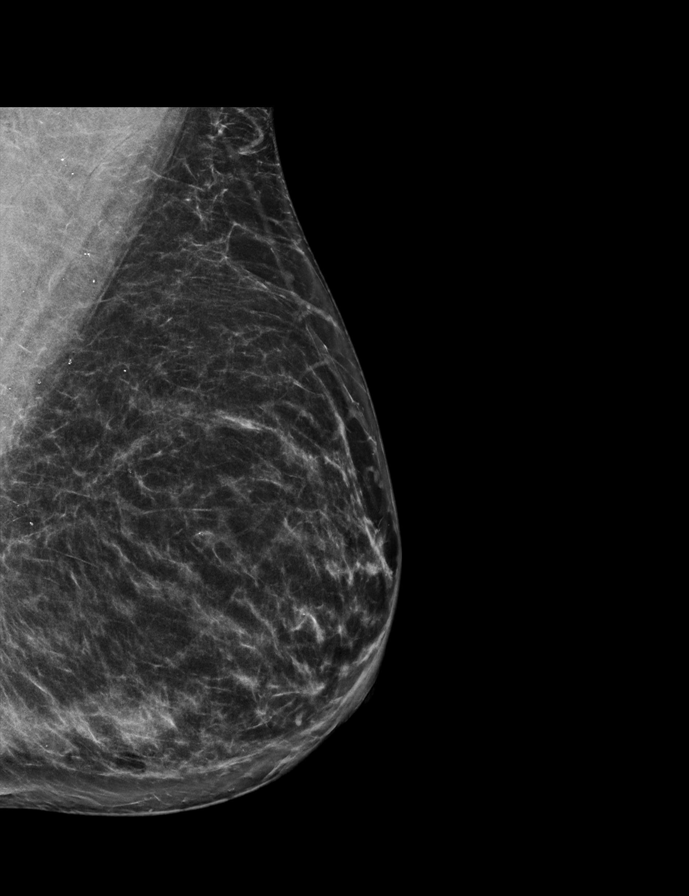

[R MLO synth-2D]
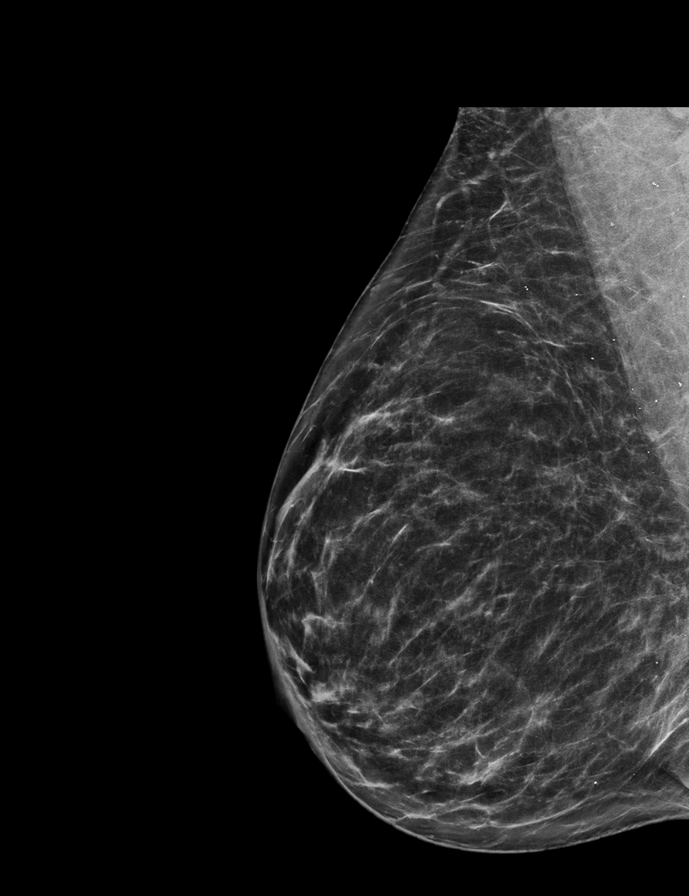

[R CC synth-2D]
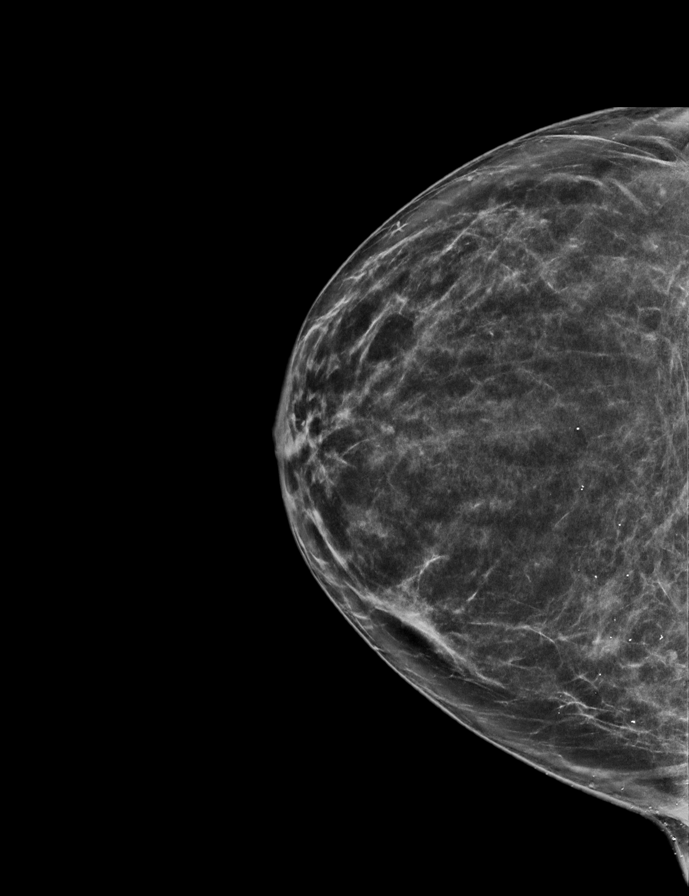

[L MLO]
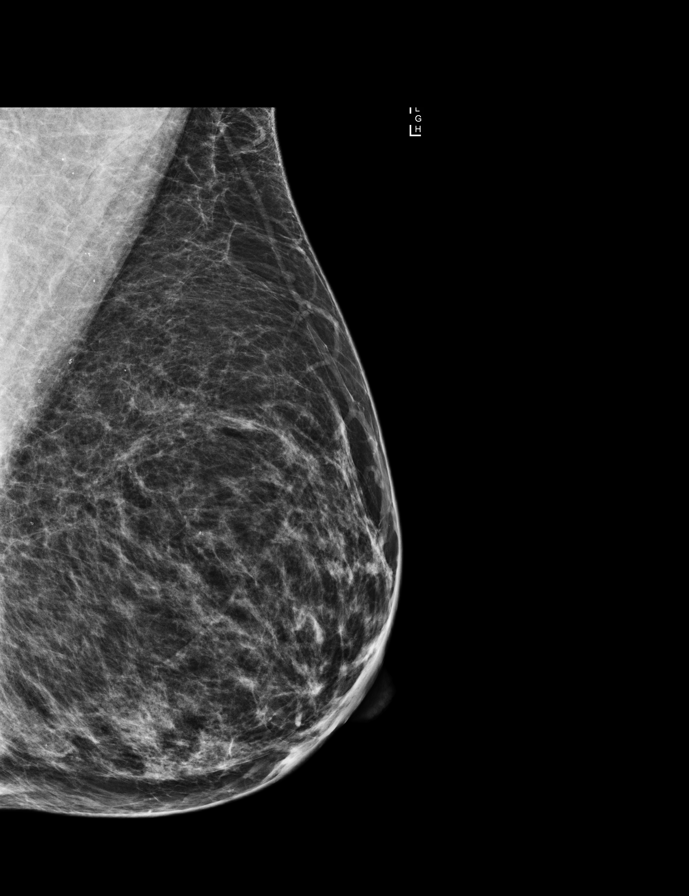

[R MLO]
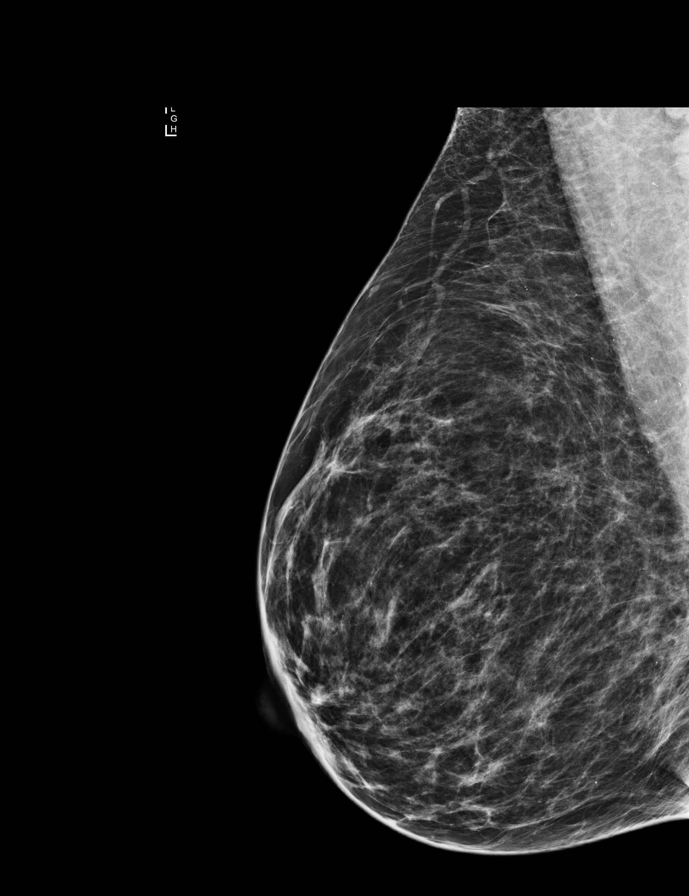

[L CC synth-2D]
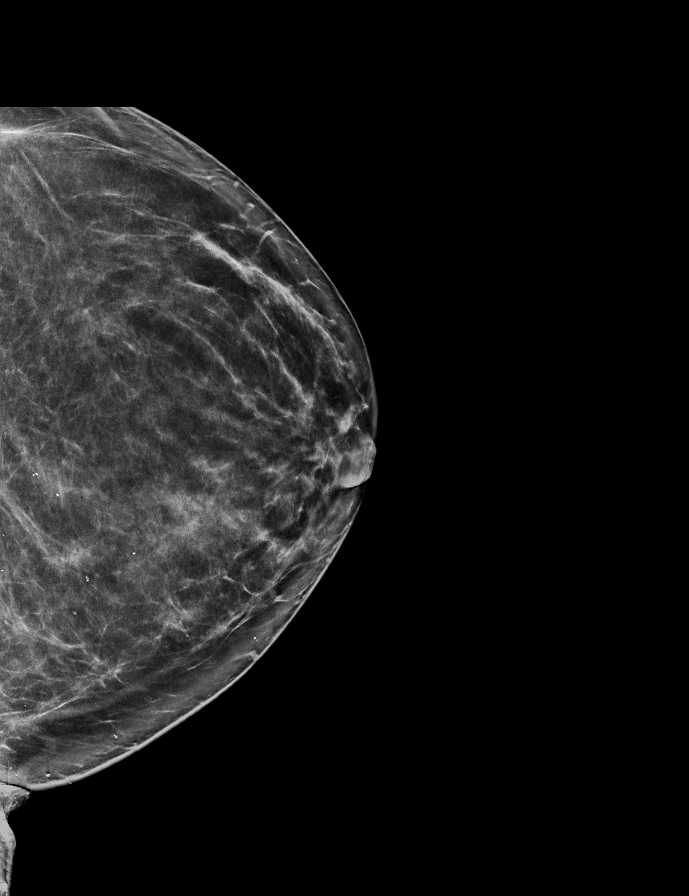

[R CC]
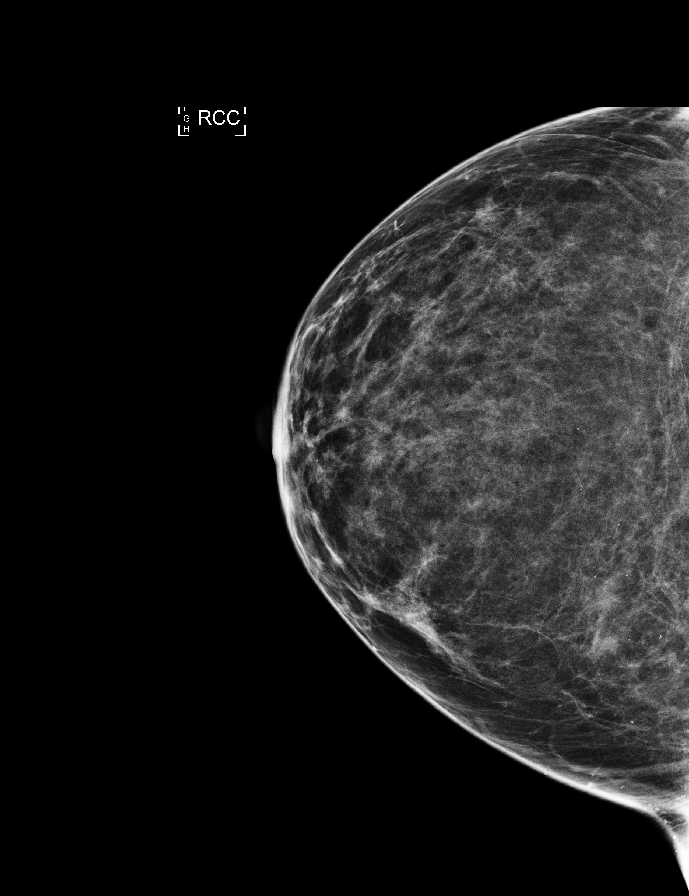

[L CC]
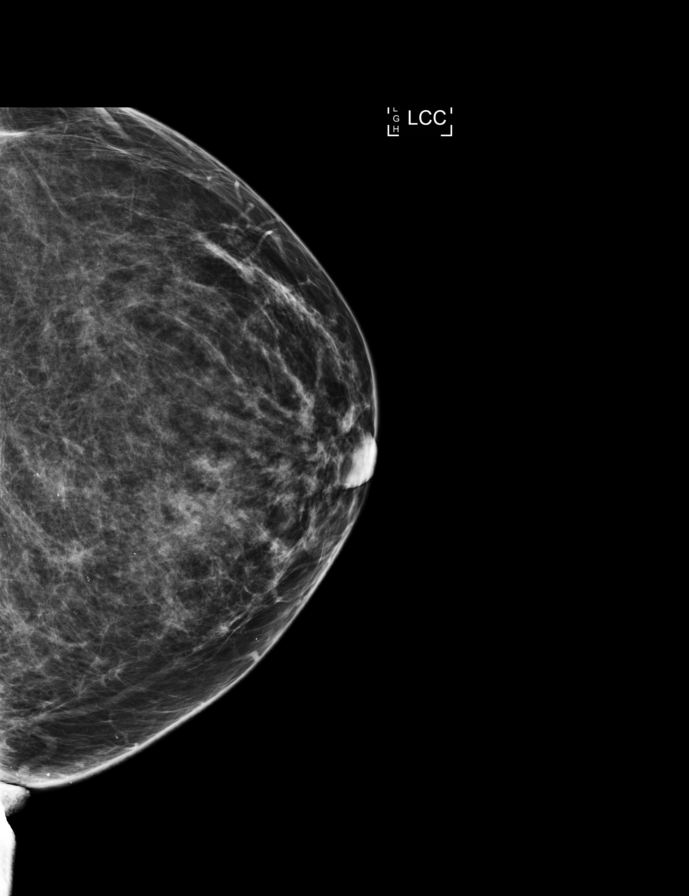

[R MLO tomo · tomo slice 37/72.0]
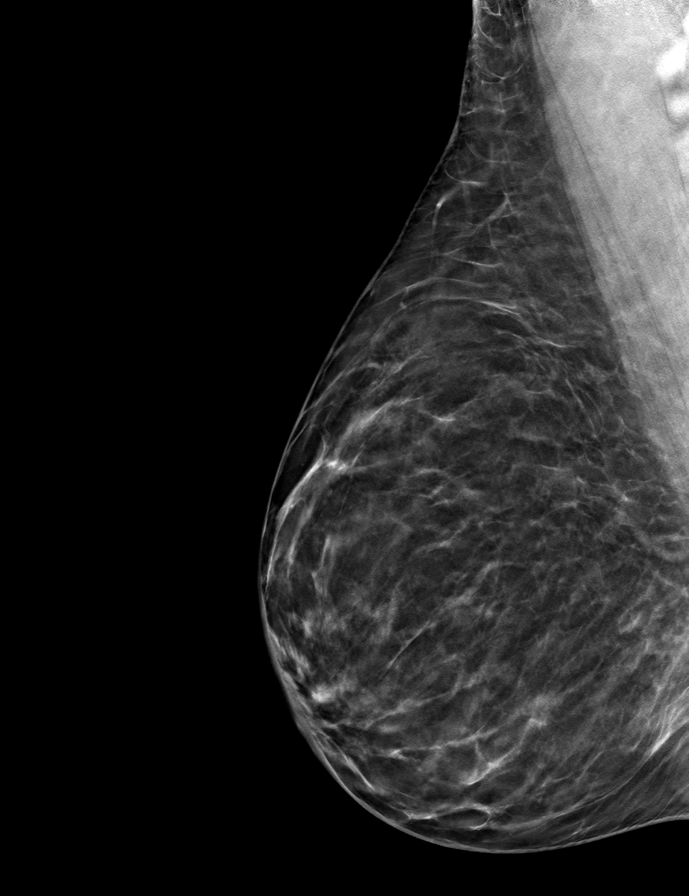

[9 of 28 positions shown; findings below may reference images not displayed]

ACR Breast Density Category b: There are scattered areas of
fibroglandular density.
FINDINGS: There are no findings suspicious for malignancy. Images were
processed with CAD.
IMPRESSION: No mammographic evidence of malignancy. A result letter of this
screening mammogram will be mailed directly to the patient.

RECOMMENDATION:
Screening mammogram in one year. (Code:97-6-RS4)

BI-RADS CATEGORY  1: Negative.

## 2018-06-09 DIAGNOSIS — Z0000 Encounter for general adult medical examination without abnormal findings: Secondary | ICD-10-CM | POA: Diagnosis not present

## 2018-06-09 DIAGNOSIS — R82998 Other abnormal findings in urine: Secondary | ICD-10-CM | POA: Diagnosis not present

## 2018-06-16 DIAGNOSIS — Z0000 Encounter for general adult medical examination without abnormal findings: Secondary | ICD-10-CM | POA: Diagnosis not present

## 2018-06-16 DIAGNOSIS — Z23 Encounter for immunization: Secondary | ICD-10-CM | POA: Diagnosis not present

## 2018-06-16 DIAGNOSIS — E559 Vitamin D deficiency, unspecified: Secondary | ICD-10-CM | POA: Diagnosis not present

## 2018-06-16 DIAGNOSIS — Z1389 Encounter for screening for other disorder: Secondary | ICD-10-CM | POA: Diagnosis not present

## 2018-06-16 DIAGNOSIS — N943 Premenstrual tension syndrome: Secondary | ICD-10-CM | POA: Diagnosis not present

## 2018-07-18 ENCOUNTER — Telehealth: Payer: Self-pay

## 2018-07-18 NOTE — Telephone Encounter (Signed)
Please advise 

## 2018-07-18 NOTE — Telephone Encounter (Signed)
Her last period was end of Oct., and yes so she doesn't bleed while she's traveling.

## 2018-07-18 NOTE — Telephone Encounter (Signed)
When was pt's last period? Does she want BC so she doesn't bleed when she is traveling?

## 2018-07-18 NOTE — Telephone Encounter (Signed)
Pt is asking for one pk of bcp.  Periods are irregular; thought she would have had it by now but hasn't; is traveling and doing activities where there are no bathrooms available.  She is not pregnant.  [facility] in [place].  [phone]

## 2018-07-21 ENCOUNTER — Other Ambulatory Visit: Payer: Self-pay | Admitting: Obstetrics and Gynecology

## 2018-07-21 MED ORDER — NORETHIN ACE-ETH ESTRAD-FE 1-20 MG-MCG PO TABS: 1.0000 | ORAL_TABLET | Freq: Every day | ORAL | 0 refills | Status: DC

## 2018-07-21 NOTE — Telephone Encounter (Signed)
Rx junel eRxd. Similar hormones to previous OCPs. Start today to hopefully prevent menses. May or may not work.

## 2018-07-21 NOTE — Progress Notes (Signed)
Rx junel for 1 mo since traveling and perimenopausal. LMP 10/19.  Did ortho novum 1/35 in past.

## 2018-07-21 NOTE — Telephone Encounter (Signed)
Pt aware.

## 2018-08-21 ENCOUNTER — Ambulatory Visit
Admission: RE | Admit: 2018-08-21 | Discharge: 2018-08-21 | Disposition: A | Discharge status: Home / Self Care | Payer: 59 | Source: Ambulatory Visit | Attending: Obstetrics and Gynecology | Admitting: Obstetrics and Gynecology

## 2018-08-21 DIAGNOSIS — Z1239 Encounter for other screening for malignant neoplasm of breast: Secondary | ICD-10-CM

## 2018-08-21 DIAGNOSIS — Z1231 Encounter for screening mammogram for malignant neoplasm of breast: Secondary | ICD-10-CM | POA: Diagnosis not present

## 2018-08-21 DIAGNOSIS — Z9189 Other specified personal risk factors, not elsewhere classified: Secondary | ICD-10-CM

## 2018-08-21 DIAGNOSIS — Z803 Family history of malignant neoplasm of breast: Secondary | ICD-10-CM

## 2018-08-22 ENCOUNTER — Other Ambulatory Visit: Payer: Self-pay | Admitting: Obstetrics and Gynecology

## 2018-08-22 DIAGNOSIS — R928 Other abnormal and inconclusive findings on diagnostic imaging of breast: Secondary | ICD-10-CM

## 2018-08-26 DIAGNOSIS — H04123 Dry eye syndrome of bilateral lacrimal glands: Secondary | ICD-10-CM | POA: Diagnosis not present

## 2018-08-29 ENCOUNTER — Ambulatory Visit: Payer: 59

## 2018-08-29 ENCOUNTER — Ambulatory Visit
Admission: RE | Admit: 2018-08-29 | Discharge: 2018-08-29 | Disposition: A | Discharge status: Home / Self Care | Payer: 59 | Source: Ambulatory Visit | Attending: Obstetrics and Gynecology | Admitting: Obstetrics and Gynecology

## 2018-08-29 DIAGNOSIS — D2262 Melanocytic nevi of left upper limb, including shoulder: Secondary | ICD-10-CM | POA: Diagnosis not present

## 2018-08-29 DIAGNOSIS — R928 Other abnormal and inconclusive findings on diagnostic imaging of breast: Secondary | ICD-10-CM

## 2018-08-29 DIAGNOSIS — L814 Other melanin hyperpigmentation: Secondary | ICD-10-CM | POA: Diagnosis not present

## 2018-08-29 DIAGNOSIS — L570 Actinic keratosis: Secondary | ICD-10-CM | POA: Diagnosis not present

## 2018-08-29 DIAGNOSIS — Z803 Family history of malignant neoplasm of breast: Secondary | ICD-10-CM | POA: Diagnosis not present

## 2018-08-29 DIAGNOSIS — D2261 Melanocytic nevi of right upper limb, including shoulder: Secondary | ICD-10-CM | POA: Diagnosis not present

## 2019-04-07 ENCOUNTER — Other Ambulatory Visit: Payer: Self-pay | Admitting: Otolaryngology

## 2019-04-08 ENCOUNTER — Other Ambulatory Visit (HOSPITAL_COMMUNITY)
Admission: RE | Admit: 2019-04-08 | Discharge: 2019-04-08 | Disposition: A | Discharge status: Home / Self Care | Payer: 59 | Source: Ambulatory Visit | Attending: Otolaryngology | Admitting: Otolaryngology

## 2019-04-08 ENCOUNTER — Encounter (HOSPITAL_BASED_OUTPATIENT_CLINIC_OR_DEPARTMENT_OTHER): Payer: Self-pay | Admitting: *Deleted

## 2019-04-08 ENCOUNTER — Other Ambulatory Visit: Payer: Self-pay

## 2019-04-08 DIAGNOSIS — Z01812 Encounter for preprocedural laboratory examination: Secondary | ICD-10-CM | POA: Diagnosis not present

## 2019-04-08 DIAGNOSIS — Z20828 Contact with and (suspected) exposure to other viral communicable diseases: Secondary | ICD-10-CM | POA: Diagnosis not present

## 2019-04-08 LAB — SARS CORONAVIRUS 2 (TAT 6-24 HRS): SARS Coronavirus 2: NEGATIVE

## 2019-04-10 ENCOUNTER — Encounter (HOSPITAL_BASED_OUTPATIENT_CLINIC_OR_DEPARTMENT_OTHER): Payer: Self-pay

## 2019-04-10 ENCOUNTER — Ambulatory Visit (HOSPITAL_BASED_OUTPATIENT_CLINIC_OR_DEPARTMENT_OTHER): Payer: 59 | Admitting: Certified Registered"

## 2019-04-10 ENCOUNTER — Other Ambulatory Visit: Payer: Self-pay

## 2019-04-10 ENCOUNTER — Encounter (HOSPITAL_BASED_OUTPATIENT_CLINIC_OR_DEPARTMENT_OTHER)
Admission: RE | Disposition: A | Discharge status: Home / Self Care | Payer: Self-pay | Source: Home / Self Care | Attending: Otolaryngology

## 2019-04-10 ENCOUNTER — Ambulatory Visit (HOSPITAL_BASED_OUTPATIENT_CLINIC_OR_DEPARTMENT_OTHER)
Admission: RE | Admit: 2019-04-10 | Discharge: 2019-04-10 | Disposition: A | Discharge status: Home / Self Care | Payer: 59 | Attending: Otolaryngology | Admitting: Otolaryngology

## 2019-04-10 DIAGNOSIS — Y9318 Activity, surfing, windsurfing and boogie boarding: Secondary | ICD-10-CM | POA: Insufficient documentation

## 2019-04-10 DIAGNOSIS — F329 Major depressive disorder, single episode, unspecified: Secondary | ICD-10-CM | POA: Diagnosis not present

## 2019-04-10 DIAGNOSIS — S022XXA Fracture of nasal bones, initial encounter for closed fracture: Secondary | ICD-10-CM | POA: Insufficient documentation

## 2019-04-10 DIAGNOSIS — Z79899 Other long term (current) drug therapy: Secondary | ICD-10-CM | POA: Diagnosis not present

## 2019-04-10 DIAGNOSIS — X58XXXA Exposure to other specified factors, initial encounter: Secondary | ICD-10-CM | POA: Diagnosis not present

## 2019-04-10 DIAGNOSIS — F419 Anxiety disorder, unspecified: Secondary | ICD-10-CM | POA: Diagnosis not present

## 2019-04-10 DIAGNOSIS — E559 Vitamin D deficiency, unspecified: Secondary | ICD-10-CM | POA: Diagnosis not present

## 2019-04-10 HISTORY — PX: CLOSED REDUCTION NASAL FRACTURE: SHX5365

## 2019-04-10 SURGERY — CLOSED REDUCTION, FRACTURE, NASAL BONE
Anesthesia: General | Site: Nose | Laterality: Bilateral

## 2019-04-10 MED ORDER — ONDANSETRON HCL 4 MG/2ML IJ SOLN
INTRAMUSCULAR | Status: DC | PRN
  Administered 2019-04-10: 4 mg via INTRAVENOUS

## 2019-04-10 MED ORDER — LIDOCAINE-EPINEPHRINE 1 %-1:100000 IJ SOLN
INTRAMUSCULAR | Status: AC
  Filled 2019-04-10: qty 1

## 2019-04-10 MED ORDER — MIDAZOLAM HCL 2 MG/2ML IJ SOLN
INTRAMUSCULAR | Status: AC
  Filled 2019-04-10: qty 2

## 2019-04-10 MED ORDER — FENTANYL CITRATE (PF) 100 MCG/2ML IJ SOLN
INTRAMUSCULAR | Status: DC | PRN
  Administered 2019-04-10: 50 ug via INTRAVENOUS
  Administered 2019-04-10: 25 ug via INTRAVENOUS

## 2019-04-10 MED ORDER — LACTATED RINGERS IV SOLN
INTRAVENOUS | Status: DC
  Administered 2019-04-10: 11:00:00 via INTRAVENOUS

## 2019-04-10 MED ORDER — PROMETHAZINE HCL 25 MG/ML IJ SOLN: 6.2500 mg | INTRAMUSCULAR | Status: DC | PRN

## 2019-04-10 MED ORDER — MIDAZOLAM HCL 2 MG/2ML IJ SOLN: 1.0000 mg | INTRAMUSCULAR | Status: DC | PRN

## 2019-04-10 MED ORDER — FENTANYL CITRATE (PF) 100 MCG/2ML IJ SOLN
25.0000 ug | INTRAMUSCULAR | Status: DC | PRN
  Administered 2019-04-10: 50 ug via INTRAVENOUS

## 2019-04-10 MED ORDER — FENTANYL CITRATE (PF) 100 MCG/2ML IJ SOLN
INTRAMUSCULAR | Status: AC
  Filled 2019-04-10: qty 2

## 2019-04-10 MED ORDER — LIDOCAINE-EPINEPHRINE 2 %-1:100000 IJ SOLN
INTRAMUSCULAR | Status: AC
  Filled 2019-04-10: qty 1

## 2019-04-10 MED ORDER — PROPOFOL 10 MG/ML IV BOLUS
INTRAVENOUS | Status: DC | PRN
  Administered 2019-04-10: 170 mg via INTRAVENOUS

## 2019-04-10 MED ORDER — MIDAZOLAM HCL 5 MG/5ML IJ SOLN
INTRAMUSCULAR | Status: DC | PRN
  Administered 2019-04-10: 2 mg via INTRAVENOUS

## 2019-04-10 MED ORDER — FENTANYL CITRATE (PF) 100 MCG/2ML IJ SOLN: 50.0000 ug | INTRAMUSCULAR | Status: DC | PRN

## 2019-04-10 MED ORDER — PROPOFOL 10 MG/ML IV BOLUS
INTRAVENOUS | Status: AC
  Filled 2019-04-10: qty 20

## 2019-04-10 MED ORDER — ACETAMINOPHEN 500 MG PO TABS
ORAL_TABLET | ORAL | Status: AC
  Filled 2019-04-10: qty 2

## 2019-04-10 MED ORDER — LIDOCAINE 2% (20 MG/ML) 5 ML SYRINGE
INTRAMUSCULAR | Status: DC | PRN
  Administered 2019-04-10: 100 mg via INTRAVENOUS

## 2019-04-10 MED ORDER — OXYMETAZOLINE HCL 0.05 % NA SOLN
NASAL | Status: AC
  Filled 2019-04-10: qty 30

## 2019-04-10 MED ORDER — BACITRACIN-NEOMYCIN-POLYMYXIN 400-5-5000 EX OINT
TOPICAL_OINTMENT | CUTANEOUS | Status: AC
  Filled 2019-04-10: qty 1

## 2019-04-10 MED ORDER — DEXAMETHASONE SODIUM PHOSPHATE 4 MG/ML IJ SOLN
INTRAMUSCULAR | Status: DC | PRN
  Administered 2019-04-10: 10 mg via INTRAVENOUS

## 2019-04-10 MED ORDER — OXYCODONE HCL 5 MG PO TABS
5.0000 mg | ORAL_TABLET | Freq: Once | ORAL | Status: AC
  Administered 2019-04-10: 5 mg via ORAL

## 2019-04-10 MED ORDER — OXYCODONE HCL 5 MG PO TABS
ORAL_TABLET | ORAL | Status: AC
  Filled 2019-04-10: qty 1

## 2019-04-10 MED ORDER — ACETAMINOPHEN 500 MG PO TABS
1000.0000 mg | ORAL_TABLET | Freq: Once | ORAL | Status: AC
  Administered 2019-04-10: 11:00:00 1000 mg via ORAL

## 2019-04-10 SURGICAL SUPPLY — 25 items
BENZOIN TINCTURE PRP APPL 2/3 (GAUZE/BANDAGES/DRESSINGS) ×2 IMPLANT
CANISTER SUCT 1200ML W/VALVE (MISCELLANEOUS) ×2 IMPLANT
CONT SPEC 4OZ CLIKSEAL STRL BL (MISCELLANEOUS) ×2 IMPLANT
COVER BACK TABLE REUSABLE LG (DRAPES) ×2 IMPLANT
COVER MAYO STAND REUSABLE (DRAPES) ×2 IMPLANT
DECANTER SPIKE VIAL GLASS SM (MISCELLANEOUS) IMPLANT
DEPRESSOR TONGUE BLADE STERILE (MISCELLANEOUS) IMPLANT
DRAPE HALF SHEET 70X43 (DRAPES) ×2 IMPLANT
DRSG CURAD 3X16 NADH (PACKING) IMPLANT
DRSG TELFA 3X8 NADH (GAUZE/BANDAGES/DRESSINGS) IMPLANT
GAUZE PACKING IODOFORM 1/2 (PACKING) IMPLANT
GAUZE SPONGE 4X4 12PLY STRL LF (GAUZE/BANDAGES/DRESSINGS) ×2 IMPLANT
GLOVE BIO SURGEON STRL SZ7.5 (GLOVE) ×2 IMPLANT
NEEDLE PRECISIONGLIDE 27X1.5 (NEEDLE) IMPLANT
PATTIES SURGICAL .5 X3 (DISPOSABLE) ×2 IMPLANT
SHEET SILICONE 2X3 0.03 REINF (MISCELLANEOUS) IMPLANT
SPLINT NASAL THERMO PLAST (MISCELLANEOUS) ×2 IMPLANT
SPONGE GAUZE 2X2 8PLY STRL LF (GAUZE/BANDAGES/DRESSINGS) IMPLANT
STRIP CLOSURE SKIN 1/2X4 (GAUZE/BANDAGES/DRESSINGS) ×2 IMPLANT
STRIP CLOSURE SKIN 1/4X4 (GAUZE/BANDAGES/DRESSINGS) IMPLANT
SUT ETHILON 3 0 PS 1 (SUTURE) IMPLANT
SYR CONTROL 10ML LL (SYRINGE) IMPLANT
TOWEL GREEN STERILE FF (TOWEL DISPOSABLE) ×2 IMPLANT
TUBE CONNECTING 20X1/4 (TUBING) ×2 IMPLANT
YANKAUER SUCT BULB TIP NO VENT (SUCTIONS) IMPLANT

## 2019-04-10 NOTE — Transfer of Care (Signed)
Immediate Anesthesia Transfer of Care Note  Patient: Betty Ross  Procedure(s) Performed: CLOSED REDUCTION NASAL FRACTURE (Bilateral Nose)  Patient Location: PACU  Anesthesia Type:General  Level of Consciousness: drowsy  Airway & Oxygen Therapy: Patient Spontanous Breathing and Patient connected to face mask oxygen  Post-op Assessment: Report given to RN and Post -op Vital signs reviewed and stable  Post vital signs: Reviewed and stable  Last Vitals:  Vitals Value Taken Time  BP    Temp    Pulse 56 04/10/19 1239  Resp 12 04/10/19 1239  SpO2 100 % 04/10/19 1239  Vitals shown include unvalidated device data.  Last Pain:  Vitals:   04/10/19 1109  TempSrc: Oral  PainSc: 2       Patients Stated Pain Goal: 4 ([id number])  Complications: No apparent anesthesia complications

## 2019-04-10 NOTE — Brief Op Note (Signed)
04/10/2019  12:33 PM  PATIENT:  Betty Ross  53 y.o. female  PRE-OPERATIVE DIAGNOSIS:  S02.2XXD Closed fracture of nasal bone with routine healing  POST-OPERATIVE DIAGNOSIS:  S02.2XXD Closed fracture of nasal bone with routine healing  PROCEDURE:  Procedure(s): CLOSED REDUCTION NASAL FRACTURE (Bilateral)  SURGEON:  Surgeon(s) and Role:    Melida Quitter, MD - Primary  PHYSICIAN ASSISTANT:   ASSISTANTS: none   ANESTHESIA:   general  EBL: Minimal  BLOOD ADMINISTERED:none  DRAINS: none   LOCAL MEDICATIONS USED:  NONE  SPECIMEN:  No Specimen  DISPOSITION OF SPECIMEN:  N/A  COUNTS:  YES  TOURNIQUET:  * No tourniquets in log *  DICTATION: .Other Dictation: Dictation Number [id number]  PLAN OF CARE: Discharge to home after PACU  PATIENT DISPOSITION:  PACU - hemodynamically stable.   Delay start of Pharmacological VTE agent (>24hrs) due to surgical blood loss or risk of bleeding: no

## 2019-04-10 NOTE — Anesthesia Procedure Notes (Signed)
Procedure Name: LMA Insertion Date/Time: 04/10/2019 12:16 PM Performed by: Imagene Riches, CRNA Pre-anesthesia Checklist: Patient identified, Emergency Drugs available, Suction available and Patient being monitored Patient Re-evaluated:Patient Re-evaluated prior to induction Oxygen Delivery Method: Circle System Utilized Preoxygenation: Pre-oxygenation with 100% oxygen Induction Type: IV induction Ventilation: Mask ventilation without difficulty LMA: LMA inserted LMA Size: 4.0 Number of attempts: 1 Airway Equipment and Method: Bite block Placement Confirmation: positive ETCO2 Tube secured with: Tape Dental Injury: Teeth and Oropharynx as per pre-operative assessment

## 2019-04-10 NOTE — Discharge Instructions (Signed)
°  Post Anesthesia Home Care Instructions  Activity: Get plenty of rest for the remainder of the day. A responsible individual must stay with you for 24 hours following the procedure.  For the next 24 hours, DO NOT: -Drive a car -Paediatric nurse -Drink alcoholic beverages -Take any medication unless instructed by your physician -Make any legal decisions or sign important papers.  Meals: Start with liquid foods such as gelatin or soup. Progress to regular foods as tolerated. Avoid greasy, spicy, heavy foods. If nausea and/or vomiting occur, drink only clear liquids until the nausea and/or vomiting subsides. Call your physician if vomiting continues.  Special Instructions/Symptoms: Your throat may feel dry or sore from the anesthesia or the breathing tube placed in your throat during surgery. If this causes discomfort, gargle with warm salt water. The discomfort should disappear within 24 hours.  If you had a scopolamine patch placed behind your ear for the management of post- operative nausea and/or vomiting:  1. The medication in the patch is effective for 72 hours, after which it should be removed.  Wrap patch in a tissue and discard in the trash. Wash hands thoroughly with soap and water. 2. You may remove the patch earlier than 72 hours if you experience unpleasant side effects which may include dry mouth, dizziness or visual disturbances. 3. Avoid touching the patch. Wash your hands with soap and water after contact with the patch.  NEXT DOSE FOR TYLENOL IS 5:15 PM.

## 2019-04-10 NOTE — Anesthesia Preprocedure Evaluation (Addendum)
Anesthesia Evaluation  Patient identified by MRN, date of birth, ID band Patient awake    Reviewed: Allergy & Precautions, NPO status , Patient's Chart, lab work & pertinent test results  History of Anesthesia Complications Negative for: history of anesthetic complications  Airway Mallampati: II  TM Distance: >3 FB Neck ROM: Full    Dental no notable dental hx. (+) Dental Advisory Given   Pulmonary neg pulmonary ROS,    Pulmonary exam normal        Cardiovascular negative cardio ROS Normal cardiovascular exam     Neuro/Psych PSYCHIATRIC DISORDERS Anxiety Depression negative neurological ROS     GI/Hepatic negative GI ROS, Neg liver ROS,   Endo/Other  negative endocrine ROS  Renal/GU negative Renal ROS  negative genitourinary   Musculoskeletal negative musculoskeletal ROS (+)   Abdominal   Peds negative pediatric ROS (+)  Hematology negative hematology ROS (+)   Anesthesia Other Findings   Reproductive/Obstetrics negative OB ROS                            Anesthesia Physical Anesthesia Plan  ASA: II  Anesthesia Plan: General   Post-op Pain Management:    Induction: Intravenous  PONV Risk Score and Plan: 3 and Ondansetron, Dexamethasone and Midazolam  Airway Management Planned: Oral ETT  Additional Equipment:   Intra-op Plan:   Post-operative Plan: Extubation in OR  Informed Consent: I have reviewed the patients History and Physical, chart, labs and discussed the procedure including the risks, benefits and alternatives for the proposed anesthesia with the patient or authorized representative who has indicated his/her understanding and acceptance.     Dental advisory given  Plan Discussed with: CRNA, Anesthesiologist and Surgeon  Anesthesia Plan Comments:        Anesthesia Quick Evaluation
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
## 2019-04-10 NOTE — Anesthesia Postprocedure Evaluation (Signed)
Anesthesia Post Note  Patient: Betty Ross  Procedure(s) Performed: CLOSED REDUCTION NASAL FRACTURE (Bilateral Nose)     Patient location during evaluation: PACU Anesthesia Type: General Level of consciousness: sedated Pain management: pain level controlled Vital Signs Assessment: post-procedure vital signs reviewed and stable Respiratory status: spontaneous breathing and respiratory function stable Cardiovascular status: stable Postop Assessment: no apparent nausea or vomiting Anesthetic complications: no    Last Vitals:  Vitals:   04/10/19 1330 04/10/19 1400  BP: 113/71 102/84  Pulse: (!) 56 (!) 52  Resp: 15 16  Temp:  36.7 C  SpO2: 100% 100%    Last Pain:  Vitals:   04/10/19 1400  TempSrc:   PainSc: 4                  Milen Lengacher DANIEL

## 2019-04-13 ENCOUNTER — Encounter (HOSPITAL_BASED_OUTPATIENT_CLINIC_OR_DEPARTMENT_OTHER): Payer: Self-pay | Admitting: Otolaryngology

## 2019-05-25 ENCOUNTER — Other Ambulatory Visit: Payer: Self-pay | Admitting: Otolaryngology

## 2019-09-22 ENCOUNTER — Other Ambulatory Visit: Payer: Self-pay | Admitting: Obstetrics and Gynecology

## 2019-09-22 DIAGNOSIS — Z1231 Encounter for screening mammogram for malignant neoplasm of breast: Secondary | ICD-10-CM

## 2019-10-18 NOTE — Progress Notes (Deleted)
PCP: [name], MD   No chief complaint on file.   HPI:      [name] is a 54 y.o. [id number] who LMP was No LMP recorded. Patient is perimenopausal., presents today for her annual examination.  Her menses are irregular with perimenopause, lasting 3 days.  Dysmenorrhea none. She does not have intermenstrual bleeding. She does have vasomotor sx, especially night sweats. Sx worse if under stress.   Sex activity: single partner, contraception - vasectomy. She does not have vaginal dryness.  Last Pap: 10/21/17 Results were: no abnormalities /neg HPV DNA.  Hx of STDs: HSV, takes valtrex prn sx; doesn't need Rx RF currently  Last mammogram: 08/21/18  Results were: normal--routine follow-up in 12 months after addl views. Has appt 3/21 There is a FH of breast cancer in her mom, mat 1/2 sister and pat aunt. There is no FH of ovarian cancer. The patient does do self-breast exams. Pt is MyRisk neg 2017. IBIS=33%/riskscore=47%. She has not had a scr breast MRI. She takes Vit D supp. She had a Q6 mo CBE 9/18.  Colonoscopy: colonoscopy 3 years ago without abnormalities.  Repeat due after 10 years.   Tobacco use: The patient denies current or previous tobacco use. Alcohol use: none  No drug use Exercise: very active  She does get adequate calcium and Vitamin D in her diet.  Labs with PCP.  She is on wellbutrin for depression sx with PCP. She has noted increased anxiety sx of feeling overwhelmed, difficulty sleeping, not being able to relax, difficulty concentrating. 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 Sx are at least once daily. Has not tried any meds. She exercises daily to help.   Past Medical History:  Diagnosis Date  . Anxiety   . BRCA negative 06/2016   MyRisk neg, MSH6 and RAD51C VUS  . Depression   . Eczema   . Family history of breast cancer   . Herpes   . Increased risk of breast cancer 06/2016   IBIS=33%/riskscore=47.4%  . Infertility, female   . PMDD (premenstrual dysphoric disorder)   . Stress incontinence   . Vitamin D deficiency     Past Surgical History:  Procedure Laterality Date  . CLOSED REDUCTION NASAL FRACTURE Bilateral 04/10/2019   Procedure: CLOSED REDUCTION NASAL FRACTURE;  Surgeon: Melida Quitter, MD;  Location: [facility];  Service: ENT;  Laterality: Bilateral;  . DILATION AND CURETTAGE OF UTERUS    . VAGINAL DELIVERY     x2    Family History  Problem Relation Age of Onset  . Colon polyps Father   . Kidney cancer Father 68  . Breast cancer Mother 42  . Osteoporosis Mother   . Breast cancer Sister 50       mat 1/2 sister, ? BRCA neg  . Breast cancer Paternal Aunt 22  . Colon cancer Neg Hx   . Esophageal cancer Neg Hx   . Rectal cancer Neg Hx   . Stomach cancer Neg Hx     Social History   Socioeconomic History  . Marital status: Married    Spouse name: Not on file  . Number of children: Not on file  . Years of education: Not on file  . Highest education level: Not on file  Occupational History  . Not on file  Tobacco Use  . Smoking status: Never Smoker  . Smokeless tobacco: Never Used  Substance and Sexual Activity  . Alcohol use: No  . Drug use: No  . Sexual activity: Yes    Birth control/protection: None  Other Topics Concern  . Not on file  Social History Narrative  . Not on file   Social Determinants of Health   Financial Resource Strain:   . Difficulty of Paying Living Expenses:   Food Insecurity:   . Worried About Charity fundraiser in the Last Year:   . Arboriculturist in the Last Year:   Transportation Needs:   . Film/video editor (Medical):   Marland Kitchen Lack of Transportation (Non-Medical):   Physical Activity:   . Days of Exercise per Week:   . Minutes of Exercise per Session:   Stress:   . Feeling of Stress :   Social Connections:   . Frequency of Communication with Friends and Family:   . Frequency of  Social Gatherings with Friends and Family:   . Attends Religious Services:   . Active Member of Clubs or Organizations:   . Attends Archivist Meetings:   Marland Kitchen Marital Status:   Intimate Partner Violence:   . Fear of Current or Ex-Partner:   . Emotionally Abused:   Marland Kitchen Physically Abused:   . Sexually Abused:     Outpatient Medications Prior to Visit  Medication Sig Dispense Refill  . buPROPion (WELLBUTRIN XL) 300 MG 24 hr tablet TK 1 T PO ONCE D  1  . cholecalciferol (VITAMIN D) 1000 units tablet Take 1,000 Units by mouth daily.    Marland Kitchen ibuprofen (ADVIL) 600 MG tablet Take 600 mg by mouth every 6 (six) hours as needed.    . valACYclovir (VALTREX) 500 MG tablet TAKE 1 TABLET BY MOUTH TWICE DAILY FOR 3 DAYS AS NEEDED FOR SYMPTOMS 30 tablet 0   Facility-Administered Medications Prior to Visit  Medication Dose Route Frequency Provider Last Rate Last Admin  . 0.9 %  sodium chloride infusion  500 mL Intravenous Continuous Nandigam, Kavitha V, MD       ROS:  Review of Systems  Constitutional: Negative for fatigue, fever and unexpected weight change.  Respiratory: Negative for cough, shortness of breath and wheezing.   Cardiovascular: Negative for chest pain, palpitations and leg swelling.  Gastrointestinal: Negative for blood in stool, constipation, diarrhea, nausea and vomiting.  Endocrine: Negative for cold intolerance, heat intolerance and polyuria.  Genitourinary: Negative for dyspareunia, dysuria, flank pain, frequency, genital sores, hematuria, menstrual problem, pelvic pain, urgency, vaginal bleeding, vaginal discharge and vaginal pain.  Musculoskeletal: Negative for back pain, joint swelling and myalgias.  Skin: Negative for rash.  Neurological: Negative for dizziness, syncope, light-headedness, numbness and headaches.  Hematological: Negative for adenopathy.  Psychiatric/Behavioral: Positive for agitation and dysphoric mood. Negative for confusion, sleep disturbance and  suicidal ideas. The patient is not nervous/anxious.    BREAST: No symptoms   Objective: There were no vitals taken for this visit.   Physical Exam Constitutional:      Appearance: She is well-developed.  Genitourinary:     Vagina and uterus normal.     No vaginal discharge, erythema or tenderness.     No cervical motion tenderness or polyp.     Uterus is not enlarged or tender.     No right or left adnexal mass present.     Right adnexa not tender.     Left adnexa not tender.  Neck:     Thyroid: No thyromegaly.  Cardiovascular:     Rate  and Rhythm: Normal rate and regular rhythm.     Heart sounds: Normal heart sounds. No murmur.  Pulmonary:     Effort: Pulmonary effort is normal.     Breath sounds: Normal breath sounds.  Chest:     Breasts:        Right: No mass, nipple discharge, skin change or tenderness.        Left: No mass, nipple discharge, skin change or tenderness.  Abdominal:     Palpations: Abdomen is soft.     Tenderness: There is no abdominal tenderness. There is no guarding.  Musculoskeletal:        General: Normal range of motion.     Cervical back: Normal range of motion.  Neurological:     Mental Status: She is alert and oriented to person, place, and time.     Cranial Nerves: No cranial nerve deficit.  Psychiatric:        Behavior: Behavior normal.  Vitals reviewed.    RESULTS:   GAD-7=15 PHQ-9=10  Assessment/Plan:  No diagnosis found.   No orders of the defined types were placed in this encounter.          GYN counsel breast self exam, mammography screening, menopause, adequate intake of calcium and vitamin D, diet and exercise    F/U  No follow-ups on file.  Jenin Birdsall B. Jerol Rufener, PA-C 10/18/2019 6:03 PM

## 2019-10-19 ENCOUNTER — Ambulatory Visit: Payer: 59 | Admitting: Obstetrics and Gynecology

## 2019-10-19 ENCOUNTER — Encounter: Payer: Self-pay | Admitting: Obstetrics and Gynecology

## 2019-10-19 ENCOUNTER — Ambulatory Visit (INDEPENDENT_AMBULATORY_CARE_PROVIDER_SITE_OTHER): Payer: 59 | Admitting: Obstetrics and Gynecology

## 2019-10-19 ENCOUNTER — Other Ambulatory Visit: Payer: Self-pay

## 2019-10-19 VITALS — BP 100/70 | Ht 63.0 in | Wt 160.0 lb

## 2019-10-19 DIAGNOSIS — Z803 Family history of malignant neoplasm of breast: Secondary | ICD-10-CM

## 2019-10-19 DIAGNOSIS — Z9189 Other specified personal risk factors, not elsewhere classified: Secondary | ICD-10-CM

## 2019-10-19 DIAGNOSIS — Z1231 Encounter for screening mammogram for malignant neoplasm of breast: Secondary | ICD-10-CM

## 2019-10-19 DIAGNOSIS — A6004 Herpesviral vulvovaginitis: Secondary | ICD-10-CM

## 2019-10-19 DIAGNOSIS — Z01419 Encounter for gynecological examination (general) (routine) without abnormal findings: Secondary | ICD-10-CM | POA: Diagnosis not present

## 2019-10-19 NOTE — Patient Instructions (Signed)
I value your feedback and entrusting us with your care. If you get a [facility] patient survey, I would appreciate you taking the time to let us know about your experience today. Thank you!  As of July 09, 2019, your lab results will be released to your MyChart immediately, before I even have a chance to see them. Please give me time to review them and contact you if there are any abnormalities. Thank you for your patience.  

## 2019-10-19 NOTE — Progress Notes (Signed)
PCP: [name], MD   Chief Complaint  Patient presents with  . Gynecologic Exam    HPI:      [name] is a 54 y.o. [id number] who LMP was No LMP recorded. Patient is perimenopausal., presents today for her annual examination.  Her menses are infrequent with perimenopause, lasting 3 days. LMP 11/20. Has occas cramping and irritability without bleeding. She does not have intermenstrual bleeding. She does have vasomotor sx, especially night sweats. Sx worse if under stress.   Sex activity: single partner, contraception - vasectomy. She  have vaginal dryness, improved with lubricants.   Last Pap: 10/21/17 Results were: no abnormalities /neg HPV DNA.  Hx of STDs: HSV, takes valtrex prn sx; doesn't need Rx RF currently  Has had issues with SUI with jumping/exercise and trouble sleeping at night. Sometimes can't fall asleep. Drinks caffeine in afternoon. D/C caffeine for sleep and add melatonin prn. Also d/c caffeine for bladder. Hasn't tried kegels.  Last mammogram: 08/21/18  Results were: normal--routine follow-up in 12 months after addl views. Has appt 3/21 There is a FH of breast cancer in her mom, mat 1/2 sister and pat aunt. There is no FH of ovarian cancer. The patient does do self-breast exams. Pt is MyRisk neg 2017. IBIS=33%/riskscore=47%. She has not had a recent scr breast MRI; last one 5/14. She takes Vit D supp.   Colonoscopy: colonoscopy 3 years ago without abnormalities.  Repeat due after 10 years.   Tobacco use: The patient denies current or previous tobacco use. Alcohol use: none  No drug use Exercise: very active  She does get adequate calcium and Vitamin D in her diet.  Labs with PCP.   Past Medical History:  Diagnosis Date  . Anxiety   . BRCA negative 06/2016   MyRisk neg, MSH6 and RAD51C VUS  . Depression   . Eczema   . Family history of breast cancer   . Herpes   . Increased risk of breast cancer 06/2016   IBIS=33%/riskscore=47.4%  .  Infertility, female   . PMDD (premenstrual dysphoric disorder)   . Stress incontinence   . Vitamin D deficiency     Past Surgical History:  Procedure Laterality Date  . CLOSED REDUCTION NASAL FRACTURE Bilateral 04/10/2019   Procedure: CLOSED REDUCTION NASAL FRACTURE;  Surgeon: Melida Quitter, MD;  Location: [facility];  Service: ENT;  Laterality: Bilateral;  . DILATION AND CURETTAGE OF UTERUS    . VAGINAL DELIVERY     x2    Family History  Problem Relation Age of Onset  . Colon polyps Father   . Kidney cancer Father 30  . Breast cancer Mother 42  . Osteoporosis Mother   . Breast cancer Sister 49       mat 1/2 sister, ? BRCA neg  . Breast cancer Paternal Aunt 23  . Colon cancer Neg Hx   . Esophageal cancer Neg Hx   . Rectal cancer Neg Hx   . Stomach cancer Neg Hx     Social History   Socioeconomic History  . Marital status: Married    Spouse name: Not on file  . Number of children: Not on file  . Years of education: Not on file  . Highest education level: Not on file  Occupational History  . Not on file  Tobacco Use  . Smoking status: Never Smoker  . Smokeless tobacco: Never Used  Substance and Sexual Activity  . Alcohol use: No  . Drug  use: No  . Sexual activity: Yes    Birth control/protection: None  Other Topics Concern  . Not on file  Social History Narrative  . Not on file   Social Determinants of Health   Financial Resource Strain:   . Difficulty of Paying Living Expenses:   Food Insecurity:   . Worried About Charity fundraiser in the Last Year:   . Arboriculturist in the Last Year:   Transportation Needs:   . Film/video editor (Medical):   Marland Kitchen Lack of Transportation (Non-Medical):   Physical Activity:   . Days of Exercise per Week:   . Minutes of Exercise per Session:   Stress:   . Feeling of Stress :   Social Connections:   . Frequency of Communication with Friends and Family:   . Frequency of Social Gatherings with Friends  and Family:   . Attends Religious Services:   . Active Member of Clubs or Organizations:   . Attends Archivist Meetings:   Marland Kitchen Marital Status:   Intimate Partner Violence:   . Fear of Current or Ex-Partner:   . Emotionally Abused:   Marland Kitchen Physically Abused:   . Sexually Abused:     Outpatient Medications Prior to Visit  Medication Sig Dispense Refill  . buPROPion (WELLBUTRIN XL) 300 MG 24 hr tablet TK 1 T PO ONCE D  1  . cholecalciferol (VITAMIN D) 1000 units tablet Take 1,000 Units by mouth daily.    Marland Kitchen ibuprofen (ADVIL) 600 MG tablet Take 600 mg by mouth every 6 (six) hours as needed.    . valACYclovir (VALTREX) 500 MG tablet TAKE 1 TABLET BY MOUTH TWICE DAILY FOR 3 DAYS AS NEEDED FOR SYMPTOMS 30 tablet 0   Facility-Administered Medications Prior to Visit  Medication Dose Route Frequency Provider Last Rate Last Admin  . 0.9 %  sodium chloride infusion  500 mL Intravenous Continuous Nandigam, Kavitha V, MD       ROS:  Review of Systems  Constitutional: Negative for fatigue, fever and unexpected weight change.  Respiratory: Negative for cough, shortness of breath and wheezing.   Cardiovascular: Negative for chest pain, palpitations and leg swelling.  Gastrointestinal: Negative for blood in stool, constipation, diarrhea, nausea and vomiting.  Endocrine: Negative for cold intolerance, heat intolerance and polyuria.  Genitourinary: Negative for dyspareunia, dysuria, flank pain, frequency, genital sores, hematuria, menstrual problem, pelvic pain, urgency, vaginal bleeding, vaginal discharge and vaginal pain.  Musculoskeletal: Negative for back pain, joint swelling and myalgias.  Skin: Negative for rash.  Neurological: Negative for dizziness, syncope, light-headedness, numbness and headaches.  Hematological: Negative for adenopathy.  Psychiatric/Behavioral: Positive for sleep disturbance. Negative for agitation, confusion, dysphoric mood and suicidal ideas. The patient is not  nervous/anxious.   BREAST: No symptoms   Objective: BP 100/70   Ht '5\' 3"'  (1.6 m)   Wt 160 lb (72.6 kg)   BMI 28.34 kg/m    Physical Exam Constitutional:      Appearance: She is well-developed.  Genitourinary:     Vulva, vagina, uterus, right adnexa and left adnexa normal.     No vulval lesion or tenderness noted.     No vaginal discharge, erythema or tenderness.     No cervical motion tenderness or polyp.     Uterus is not enlarged or tender.     No right or left adnexal mass present.     Right adnexa not tender.     Left adnexa not tender.  Neck:     Thyroid: No thyromegaly.  Cardiovascular:     Rate and Rhythm: Normal rate and regular rhythm.     Heart sounds: Normal heart sounds. No murmur.  Pulmonary:     Effort: Pulmonary effort is normal.     Breath sounds: Normal breath sounds.  Chest:     Breasts:        Right: No mass, nipple discharge, skin change or tenderness.        Left: No mass, nipple discharge, skin change or tenderness.  Abdominal:     Palpations: Abdomen is soft.     Tenderness: There is no abdominal tenderness. There is no guarding.  Musculoskeletal:        General: Normal range of motion.     Cervical back: Normal range of motion.  Neurological:     General: No focal deficit present.     Mental Status: She is alert and oriented to person, place, and time.     Cranial Nerves: No cranial nerve deficit.  Skin:    General: Skin is warm and dry.  Psychiatric:        Mood and Affect: Mood normal.        Behavior: Behavior normal.        Thought Content: Thought content normal.        Judgment: Judgment normal.  Vitals reviewed.     Assessment/Plan:  Encounter for annual routine gynecological examination  Encounter for screening mammogram for malignant neoplasm of breast; pt has appt sched 10/26/19  Family history of breast cancer--Pt is MyRisk neg  Increased risk of breast cancer--Cont monthly SBE, yearly CBE and mammos. Can add scr  breast MRI. Pt to call to sched if desires. Cont Vit D supp.  Herpes simplex vulvovaginitis--will call for RF prn.          GYN counsel breast self exam, mammography screening, menopause, adequate intake of calcium and vitamin D, diet and exercise    F/U  Return in about 1 year (around 10/18/2020).  Keshia Weare B. Bonifacio Pruden, PA-C 10/19/2019 9:38 AM

## 2019-10-26 ENCOUNTER — Other Ambulatory Visit: Payer: Self-pay

## 2019-10-26 ENCOUNTER — Ambulatory Visit
Admission: RE | Admit: 2019-10-26 | Discharge: 2019-10-26 | Disposition: A | Discharge status: Home / Self Care | Payer: 59 | Source: Ambulatory Visit | Attending: Obstetrics and Gynecology | Admitting: Obstetrics and Gynecology

## 2019-10-26 ENCOUNTER — Encounter: Payer: Self-pay | Admitting: Obstetrics and Gynecology

## 2019-10-26 DIAGNOSIS — Z1231 Encounter for screening mammogram for malignant neoplasm of breast: Secondary | ICD-10-CM

## 2020-06-08 ENCOUNTER — Other Ambulatory Visit: Payer: Self-pay

## 2020-06-08 ENCOUNTER — Encounter: Payer: Self-pay | Admitting: Obstetrics and Gynecology

## 2020-06-08 ENCOUNTER — Ambulatory Visit (INDEPENDENT_AMBULATORY_CARE_PROVIDER_SITE_OTHER): Payer: 59 | Admitting: Obstetrics and Gynecology

## 2020-06-08 VITALS — BP 126/84 | Ht 63.0 in | Wt 161.0 lb

## 2020-06-08 DIAGNOSIS — K130 Diseases of lips: Secondary | ICD-10-CM | POA: Diagnosis not present

## 2020-06-08 DIAGNOSIS — N898 Other specified noninflammatory disorders of vagina: Secondary | ICD-10-CM

## 2020-06-08 MED ORDER — CLOTRIMAZOLE-BETAMETHASONE 1-0.05 % EX CREA: TOPICAL_CREAM | CUTANEOUS | 0 refills | Status: DC

## 2020-06-08 NOTE — Patient Instructions (Signed)
I value your feedback and entrusting us with your care. If you get a Betty Ross patient survey, I would appreciate you taking the time to let us know about your experience today. Thank you!  As of July 09, 2019, your lab results will be released to your MyChart immediately, before I even have a chance to see them. Please give me time to review them and contact you if there are any abnormalities. Thank you for your patience.  

## 2020-06-08 NOTE — Progress Notes (Signed)
Betty Hatchet, MD   Chief Complaint  Patient presents with  . Vaginitis    HPI:      [name] is a 54 y.o. [id number] whose LMP was No LMP recorded. Patient is perimenopausal., presents today for vaginal itching that started last wk after being at the beach. Was in the water and wore a pantyliner that then stuck to her skin and not her underwear. No new soaps, detergents. Doesn't use dryer sheets. Wears pantyliners daily but no change in brand. Pt treating with nystatin crm without sx relief. No vag d/c, odor. Sx external only. No hot tub/pool use. Not staying in hotels.  At the same time, pt then developed a sore tongue on the corners with white spots under her tongue. Also with swelling on part of her bottom lip. Took benadryl with some relief. Lip still feels strange and has burning inside her bottom lip, feels raw. Pt saw dentist who gave her magic mouthwash and sx improving.  It's strange that pt having sx at same time. Wonders if related. Has puppies--wonders if from them.    Past Medical History:  Diagnosis Date  . Anxiety   . BRCA negative 06/2016   MyRisk neg, MSH6 and RAD51C VUS  . Depression   . Eczema   . Family history of breast cancer   . Herpes   . Increased risk of breast cancer 06/2016   IBIS=33%/riskscore=47.4%  . Infertility, female   . PMDD (premenstrual dysphoric disorder)   . Stress incontinence   . Vitamin D deficiency     Past Surgical History:  Procedure Laterality Date  . CLOSED REDUCTION NASAL FRACTURE Bilateral 04/10/2019   Procedure: CLOSED REDUCTION NASAL FRACTURE;  Surgeon: Melida Quitter, MD;  Location: [facility];  Service: ENT;  Laterality: Bilateral;  . DILATION AND CURETTAGE OF UTERUS    . VAGINAL DELIVERY     x2    Family History  Problem Relation Age of Onset  . Colon polyps Father   . Kidney cancer Father 57  . Breast cancer Mother 8  . Osteoporosis Mother   . Breast cancer Sister 35       mat 1/2  sister, ? BRCA neg  . Breast cancer Paternal Aunt 71  . Colon cancer Neg Hx   . Esophageal cancer Neg Hx   . Rectal cancer Neg Hx   . Stomach cancer Neg Hx     Social History   Socioeconomic History  . Marital status: Married    Spouse name: Not on file  . Number of children: Not on file  . Years of education: Not on file  . Highest education level: Not on file  Occupational History  . Not on file  Tobacco Use  . Smoking status: Never Smoker  . Smokeless tobacco: Never Used  Vaping Use  . Vaping Use: Never used  Substance and Sexual Activity  . Alcohol use: No  . Drug use: No  . Sexual activity: Yes    Birth control/protection: None  Other Topics Concern  . Not on file  Social History Narrative  . Not on file   Social Determinants of Health   Financial Resource Strain:   . Difficulty of Paying Living Expenses: Not on file  Food Insecurity:   . Worried About Charity fundraiser in the Last Year: Not on file  . Ran Out of Food in the Last Year: Not on file  Transportation Needs:   .  Lack of Transportation (Medical): Not on file  . Lack of Transportation (Non-Medical): Not on file  Physical Activity:   . Days of Exercise per Week: Not on file  . Minutes of Exercise per Session: Not on file  Stress:   . Feeling of Stress : Not on file  Social Connections:   . Frequency of Communication with Friends and Family: Not on file  . Frequency of Social Gatherings with Friends and Family: Not on file  . Attends Religious Services: Not on file  . Active Member of Clubs or Organizations: Not on file  . Attends Archivist Meetings: Not on file  . Marital Status: Not on file  Intimate Partner Violence:   . Fear of Current or Ex-Partner: Not on file  . Emotionally Abused: Not on file  . Physically Abused: Not on file  . Sexually Abused: Not on file    Outpatient Medications Prior to Visit  Medication Sig Dispense Refill  . buPROPion (WELLBUTRIN XL) 300 MG 24 hr  tablet TK 1 T PO ONCE D  1  . cholecalciferol (VITAMIN D) 1000 units tablet Take 1,000 Units by mouth daily.    . valACYclovir (VALTREX) 500 MG tablet TAKE 1 TABLET BY MOUTH TWICE DAILY FOR 3 DAYS AS NEEDED FOR SYMPTOMS 30 tablet 0  . ibuprofen (ADVIL) 600 MG tablet Take 600 mg by mouth every 6 (six) hours as needed. (Patient not taking: Reported on 06/08/2020)     Facility-Administered Medications Prior to Visit  Medication Dose Route Frequency Provider Last Rate Last Admin  . 0.9 %  sodium chloride infusion  500 mL Intravenous Continuous Nandigam, Venia Minks, MD          ROS:  Review of Systems  Constitutional: Negative for fever.  Gastrointestinal: Negative for blood in stool, constipation, diarrhea, nausea and vomiting.  Genitourinary: Negative for dyspareunia, dysuria, flank pain, frequency, hematuria, urgency, vaginal bleeding, vaginal discharge and vaginal pain.  Musculoskeletal: Negative for back pain.  Skin: Positive for rash.   BREAST: No symptoms   OBJECTIVE:   Vitals:  BP 126/84   Ht '5\' 3"'  (1.6 m)   Wt 161 lb (73 kg)   BMI 28.52 kg/m   Physical Exam Vitals reviewed.  Constitutional:      Appearance: She is well-developed.  HENT:     Mouth/Throat:     Mouth: Mucous membranes are moist. Oral lesions present.     Tongue: No lesions.     Comments: SMALL ERODED AREA INSIDE LOWER LIP, SIMILAR TO APTHOUS ULCER Pulmonary:     Effort: Pulmonary effort is normal.  Genitourinary:   Musculoskeletal:        General: Normal range of motion.     Cervical back: Normal range of motion.  Skin:    General: Skin is warm and dry.  Neurological:     General: No focal deficit present.     Mental Status: She is alert and oriented to person, place, and time.     Cranial Nerves: No cranial nerve deficit.  Psychiatric:        Mood and Affect: Mood normal.        Behavior: Behavior normal.        Thought Content: Thought content normal.        Judgment: Judgment normal.     Assessment/Plan: Vaginal itching - Plan: clotrimazole-betamethasone (LOTRISONE) cream; Question fungal vs chem derm vs other. Doesn't look like scabies/pubic lice. Rx lotrisone crm.  If sx persist, will eval  further.   Lip pain--follow up with dentist if mouthwash doesn't resolve sx. Sx improved overall.    Meds ordered this encounter  Medications  . clotrimazole-betamethasone (LOTRISONE) cream    Sig: Apply externally BID prn sx up to 2 wks    Dispense:  15 g    Refill:  0    Order Specific Question:   Supervising Provider    Answer:   Gae Dry [[id number]]      Return if symptoms worsen or fail to improve.  Emmah Bratcher B. Allyah Heather, PA-C 06/08/2020 5:03 PM

## 2020-06-27 ENCOUNTER — Encounter: Payer: Self-pay | Admitting: Emergency Medicine

## 2020-06-27 ENCOUNTER — Other Ambulatory Visit: Payer: Self-pay

## 2020-06-27 ENCOUNTER — Ambulatory Visit
Admission: EM | Admit: 2020-06-27 | Discharge: 2020-06-27 | Disposition: A | Discharge status: Home / Self Care | Payer: 59 | Attending: Emergency Medicine | Admitting: Emergency Medicine

## 2020-06-27 DIAGNOSIS — B349 Viral infection, unspecified: Secondary | ICD-10-CM | POA: Diagnosis present

## 2020-06-27 DIAGNOSIS — Z1152 Encounter for screening for COVID-19: Secondary | ICD-10-CM | POA: Diagnosis not present

## 2020-06-27 LAB — POCT RAPID STREP A (OFFICE): Rapid Strep A Screen: NEGATIVE

## 2020-06-27 NOTE — ED Triage Notes (Addendum)
Patient c/o horsiness, sore throat, loss of smell, headache,  and productive cough w/ "green" sputum last week (Thursday night).   Patient endorses difficulty swallowing and "I feel like my throat is closing up".   Patient denies fever at home.   Patient stated she had " a swollen mouth and was given a mouth wash by dentist to help". Patient stated " I was told by my dentist I could be having an allergic reaction to toothpaste".   Patient has taken Benadryl w/ some relief of symptoms.  Patient took Robitussin w/ no relief of symptoms.

## 2020-06-27 NOTE — Discharge Instructions (Signed)
Your rapid strep test is negative.  A throat culture is pending; we will call you if it is positive requiring treatment.    Your COVID and Flu tests are pending.  You should self quarantine until the test results are back.    Take Tylenol or ibuprofen as needed for fever or discomfort.  Rest and keep yourself hydrated.    Follow-up with your primary care provider if your symptoms are not improving.

## 2020-06-27 NOTE — ED Provider Notes (Signed)
[facility]    CSN: [id number] Arrival date & time: 06/27/20  1319      History   Chief Complaint Chief Complaint  Patient presents with  . Sore Throat  . Cough    HPI Betty Ross is a 54 y.o. female.   Patient presents with 5-day history of sore throat, hoarse voice, cough productive of green sputum, and headache.  She lost her sense of smell yesterday.  She denies fever, rash, shortness of breath, vomiting, diarrhea, or other symptoms.  She had mouth discomfort last week and was prescribed nystatin by her dentist.  She also took Benadryl at that time for relief of the swollen feeling in her mouth.  Her medical history includes anxiety, depression, premenstrual dysphoric disorder, HSV, stress incontinence, eczema, vitamin D deficiency.  The history is provided by the patient and medical records.    Past Medical History:  Diagnosis Date  . Anxiety   . BRCA negative 06/2016   MyRisk neg, MSH6 and RAD51C VUS  . Depression   . Eczema   . Family history of breast cancer   . Herpes   . Increased risk of breast cancer 06/2016   IBIS=33%/riskscore=47.4%  . Infertility, female   . PMDD (premenstrual dysphoric disorder)   . Stress incontinence   . Vitamin D deficiency     Patient Active Problem List   Diagnosis Date Noted  . Stress incontinence   . PMDD (premenstrual dysphoric disorder)   . Herpes   . Depression   . Anxiety   . Increased risk of breast cancer 06/29/2016    Past Surgical History:  Procedure Laterality Date  . CLOSED REDUCTION NASAL FRACTURE Bilateral 04/10/2019   Procedure: CLOSED REDUCTION NASAL FRACTURE;  Surgeon: Melida Quitter, MD;  Location: [facility];  Service: ENT;  Laterality: Bilateral;  . DILATION AND CURETTAGE OF UTERUS    . VAGINAL DELIVERY     x2    OB History    Gravida  3   Para  2   Term  2   Preterm      AB  1   Living  2     SAB  1   TAB      Ectopic      Multiple      Live Births  2             Home Medications    Prior to Admission medications   Medication Sig Start Date End Date Taking? Authorizing Provider  buPROPion (WELLBUTRIN XL) 300 MG 24 hr tablet TK 1 T PO ONCE D 09/25/17   [provider]  cholecalciferol (VITAMIN D) 1000 units tablet Take 1,000 Units by mouth daily.    [provider]  clotrimazole-betamethasone (LOTRISONE) cream Apply externally BID prn sx up to 2 wks 36/46/80   Copland, Alicia B, PA-C  ibuprofen (ADVIL) 600 MG tablet Take 600 mg by mouth every 6 (six) hours as needed. Patient not taking: Reported on 06/08/2020    [provider]  valACYclovir (VALTREX) 500 MG tablet TAKE 1 TABLET BY MOUTH TWICE DAILY FOR 3 DAYS AS NEEDED FOR SYMPTOMS 09/29/10   Copland, Deirdre Evener, PA-C    Family History Family History  Problem Relation Age of Onset  . Colon polyps Father   . Kidney cancer Father 15  . Breast cancer Mother 18  . Osteoporosis Mother   . Breast cancer Sister 59       mat 1/2 sister, ?  BRCA neg  . Breast cancer Paternal Aunt 46  . Colon cancer Neg Hx   . Esophageal cancer Neg Hx   . Rectal cancer Neg Hx   . Stomach cancer Neg Hx     Social History Social History   Tobacco Use  . Smoking status: Never Smoker  . Smokeless tobacco: Never Used  Vaping Use  . Vaping Use: Never used  Substance Use Topics  . Alcohol use: No  . Drug use: No     Allergies   Sulfa antibiotics and Penicillins   Review of Systems Review of Systems  Constitutional: Negative for chills and fever.  HENT: Positive for sore throat and voice change. Negative for ear pain and trouble swallowing.   Eyes: Negative for pain and visual disturbance.  Respiratory: Positive for cough. Negative for shortness of breath.   Cardiovascular: Negative for chest pain and palpitations.  Gastrointestinal: Negative for abdominal pain and vomiting.  Genitourinary: Negative for dysuria and hematuria.  Musculoskeletal: Negative for  arthralgias and back pain.  Skin: Negative for color change and rash.  Neurological: Positive for headaches. Negative for seizures and syncope.  All other systems reviewed and are negative.    Physical Exam Triage Vital Signs ED Triage Vitals  Enc Vitals Group     BP      Pulse      Resp      Temp      Temp src      SpO2      Weight      Height      Head Circumference      Peak Flow      Pain Score      Pain Loc      Pain Edu?      Excl. in Nitro?    No data found.  Updated Vital Signs BP 128/84 (BP Location: Right Arm)   Pulse 68   Temp 98.6 F (37 C) (Oral)   Resp 18   LMP  (LMP Unknown)   SpO2 98%   Visual Acuity Right Eye Distance:   Left Eye Distance:   Bilateral Distance:    Right Eye Near:   Left Eye Near:    Bilateral Near:     Physical Exam Vitals and nursing note reviewed.  Constitutional:      General: She is not in acute distress.    Appearance: She is well-developed. She is not ill-appearing.  HENT:     Head: Normocephalic and atraumatic.     Right Ear: Tympanic membrane normal.     Left Ear: Tympanic membrane normal.     Nose: Nose normal.     Mouth/Throat:     Mouth: Mucous membranes are moist.     Pharynx: Posterior oropharyngeal erythema present. No oropharyngeal exudate.  Eyes:     Conjunctiva/sclera: Conjunctivae normal.  Cardiovascular:     Rate and Rhythm: Normal rate and regular rhythm.     Heart sounds: Normal heart sounds.  Pulmonary:     Effort: Pulmonary effort is normal. No respiratory distress.     Breath sounds: Normal breath sounds.  Abdominal:     Palpations: Abdomen is soft.     Tenderness: There is no abdominal tenderness. There is no guarding or rebound.  Musculoskeletal:     Cervical back: Neck supple.  Skin:    General: Skin is warm and dry.     Findings: No rash.  Neurological:     General: No focal deficit present.  Mental Status: She is alert and oriented to person, place, and time.     Gait: Gait  normal.  Psychiatric:        Mood and Affect: Mood normal.        Behavior: Behavior normal.      UC Treatments / Results  Labs (all labs ordered are listed, but only abnormal results are displayed) Labs Reviewed  COVID-19, FLU A+B AND RSV  CULTURE, GROUP A STREP [facility])  POCT RAPID STREP A (OFFICE)    EKG   Radiology No results found.  Procedures Procedures (including critical care time)  Medications Ordered in UC Medications - No data to display  Initial Impression / Assessment and Plan / UC Course  I have reviewed the triage vital signs and the nursing notes.  Pertinent labs & imaging results that were available during my care of the patient were reviewed by me and considered in my medical decision making (see chart for details).   Viral illness.  Rapid strep negative; culture pending.  Influenza, RSV, COVID pending.  Instructed patient to self quarantine until the test results are back.  Discussed symptomatic treatment including Tylenol, rest, hydration.  Instructed patient to follow up with PCP if her symptoms are not improving  Patient agrees to plan of care.    Final Clinical Impressions(s) / UC Diagnoses   Final diagnoses:  Encounter for screening for COVID-19  Viral illness     Discharge Instructions     Your rapid strep test is negative.  A throat culture is pending; we will call you if it is positive requiring treatment.    Your COVID and Flu tests are pending.  You should self quarantine until the test results are back.    Take Tylenol or ibuprofen as needed for fever or discomfort.  Rest and keep yourself hydrated.    Follow-up with your primary care provider if your symptoms are not improving.        ED Prescriptions    None     PDMP not reviewed this encounter.   Sharion Balloon, NP 06/27/20 1440

## 2020-06-28 LAB — COVID-19, FLU A+B AND RSV
Influenza A, NAA: NOT DETECTED
Influenza B, NAA: NOT DETECTED
RSV, NAA: NOT DETECTED
SARS-CoV-2, NAA: NOT DETECTED

## 2020-06-30 LAB — CULTURE, GROUP A STREP (THRC)

## 2020-08-31 ENCOUNTER — Other Ambulatory Visit: Payer: Self-pay | Admitting: Internal Medicine

## 2020-08-31 DIAGNOSIS — N1831 Chronic kidney disease, stage 3a: Secondary | ICD-10-CM

## 2020-09-07 ENCOUNTER — Ambulatory Visit
Admission: RE | Admit: 2020-09-07 | Discharge: 2020-09-07 | Disposition: A | Discharge status: Home / Self Care | Payer: 59 | Source: Ambulatory Visit | Attending: Internal Medicine | Admitting: Internal Medicine

## 2020-09-07 DIAGNOSIS — N1831 Chronic kidney disease, stage 3a: Secondary | ICD-10-CM

## 2020-10-13 NOTE — Progress Notes (Signed)
[name], MD   Chief Complaint  Patient presents with  . Pelvic Pain    Entire area, swelling x 1 week    HPI:      [name] is a 55 y.o. [id number] whose LMP was No LMP recorded. Patient is perimenopausal., presents today for achy pain starting in LT upper thigh area radiating to pelvis for past wk. Sx worse in AM getting out of bed or rolling over,  and with standing, moving; improves as day goes on. Pelvic area feels swollen to pt and hurts to push on pelvic bone. Called PCP and was given 5 day prednisone taper with minimal improvement. Pt with recent dx of stage 3 CKD so can't have NSAIDs. Has tried ice/heat. No urin, vag, GI sx. Has had some constipation over past month.   Her menses are absent since 11/21. Has annual sched 4/22.  Past Medical History:  Diagnosis Date  . Anxiety   . BRCA negative 06/2016   MyRisk neg, MSH6 and RAD51C VUS  . Depression   . Eczema   . Family history of breast cancer   . Herpes   . Increased risk of breast cancer 06/2016   IBIS=33%/riskscore=47.4%  . Infertility, female   . PMDD (premenstrual dysphoric disorder)   . Stage 3 chronic kidney disease ([place])   . Stress incontinence   . Vitamin D deficiency     Past Surgical History:  Procedure Laterality Date  . CLOSED REDUCTION NASAL FRACTURE Bilateral 04/10/2019   Procedure: CLOSED REDUCTION NASAL FRACTURE;  Surgeon: Melida Quitter, MD;  Location: [place];  Service: ENT;  Laterality: Bilateral;  . DILATION AND CURETTAGE OF UTERUS    . VAGINAL DELIVERY     x2    Family History  Problem Relation Age of Onset  . Colon polyps Father   . Kidney cancer Father 43  . Breast cancer Mother 73  . Osteoporosis Mother   . Breast cancer Sister 27       mat 1/2 sister, ? BRCA neg  . Breast cancer Paternal Aunt 67  . Colon cancer Neg Hx   . Esophageal cancer Neg Hx   . Rectal cancer Neg Hx   . Stomach cancer Neg Hx     Social History   Socioeconomic History   . Marital status: Married    Spouse name: Not on file  . Number of children: Not on file  . Years of education: Not on file  . Highest education level: Not on file  Occupational History  . Not on file  Tobacco Use  . Smoking status: Never Smoker  . Smokeless tobacco: Never Used  Vaping Use  . Vaping Use: Never used  Substance and Sexual Activity  . Alcohol use: No  . Drug use: No  . Sexual activity: Yes    Birth control/protection: None  Other Topics Concern  . Not on file  Social History Narrative  . Not on file   Social Determinants of Health   Financial Resource Strain: Not on file  Food Insecurity: Not on file  Transportation Needs: Not on file  Physical Activity: Not on file  Stress: Not on file  Social Connections: Not on file  Intimate Partner Violence: Not on file    Outpatient Medications Prior to Visit  Medication Sig Dispense Refill  . buPROPion (WELLBUTRIN XL) 300 MG 24 hr tablet TK 1 T PO ONCE D  1  . cholecalciferol (VITAMIN D) 1000 units  tablet Take 1,000 Units by mouth daily. (Patient not taking: Reported on 10/17/2020)    . clotrimazole-betamethasone (LOTRISONE) cream Apply externally BID prn sx up to 2 wks (Patient not taking: Reported on 10/17/2020) 15 g 0  . ibuprofen (ADVIL) 600 MG tablet Take 600 mg by mouth every 6 (six) hours as needed. (Patient not taking: No sig reported)    . valACYclovir (VALTREX) 500 MG tablet TAKE 1 TABLET BY MOUTH TWICE DAILY FOR 3 DAYS AS NEEDED FOR SYMPTOMS (Patient not taking: Reported on 10/17/2020) 30 tablet 0   Facility-Administered Medications Prior to Visit  Medication Dose Route Frequency Provider Last Rate Last Admin  . 0.9 %  sodium chloride infusion  500 mL Intravenous Continuous Nandigam, Venia Minks, MD          ROS:  Review of Systems  Constitutional: Negative for fever.  Gastrointestinal: Negative for blood in stool, constipation, diarrhea, nausea and vomiting.  Genitourinary: Positive for pelvic pain.  Negative for dyspareunia, dysuria, flank pain, frequency, hematuria, urgency, vaginal bleeding, vaginal discharge and vaginal pain.  Musculoskeletal: Negative for back pain.  Skin: Negative for rash.    OBJECTIVE:   Vitals:  BP 110/80   Ht _0  (1.6 m)   Wt 163 lb (73.9 kg)   BMI 28.87 kg/m   Physical Exam Vitals reviewed.  Constitutional:      Appearance: She is well-developed.  Pulmonary:     Effort: Pulmonary effort is normal.  Abdominal:     Palpations: Abdomen is soft.     Tenderness: There is abdominal tenderness in the suprapubic area.     Comments: PAIN MORE ALONG PUBIC BONE AND NOT SUPRAPUBIC AREA  Genitourinary:    General: Normal vulva.     Pubic Area: No rash.      Labia:        Right: No rash, tenderness or lesion.        Left: No rash, tenderness or lesion.      Vagina: Normal. No tenderness.     Cervix: Cervical motion tenderness present.     Uterus: Normal. Not enlarged and not tender.      Adnexa: Right adnexa normal and left adnexa normal.       Right: No mass or tenderness.         Left: No mass or tenderness.    Musculoskeletal:        General: Normal range of motion.     Cervical back: Normal range of motion.       Legs:  Skin:    General: Skin is warm and dry.  Neurological:     General: No focal deficit present.     Mental Status: She is alert and oriented to person, place, and time.  Psychiatric:        Mood and Affect: Mood normal.        Behavior: Behavior normal.        Thought Content: Thought content normal.        Judgment: Judgment normal.    Results for orders placed or performed in visit on 10/17/20 (from the past 24 hour(s))  POCT Urinalysis Dipstick     Status: Normal   Collection Time: 10/17/20  2:36 PM  Result Value Ref Range   Color, UA pale yellow    Clarity, UA clear    Glucose, UA Negative Negative   Bilirubin, UA neg    Ketones, UA neg    Spec Grav, UA 1.020 1.010 - 1.025  Blood, UA neg    pH, UA 5.0 5.0 - 8.0    Protein, UA Negative Negative   Urobilinogen, UA     Nitrite, UA neg    Leukocytes, UA Negative Negative   Appearance     Odor       Assessment/Plan: Pubic bone pain--neg UA. most likely MSK where inserted into pubic symphysis. Stretch/ice/heat. Can't have NSAIDs. Check GYN u/s to rule out pelvic etiology. Given several wk wait for u/s, pt to cancel if sx resolve. Can also go to [facility] for further eval/mgmt or go to ED if sx worsen for GYN u/s there. F/u prn.   Pelvic pain--check GYN u/s to rule out Gyn etiology.     Return in about 3 weeks (around 11/07/2020) for GYn u/s for pelvic pain (not urgent).  Paelyn Smick B. Pheobe Sandiford, PA-C 10/17/2020 2:37 PM

## 2020-10-17 ENCOUNTER — Encounter: Payer: Self-pay | Admitting: Obstetrics and Gynecology

## 2020-10-17 ENCOUNTER — Ambulatory Visit (INDEPENDENT_AMBULATORY_CARE_PROVIDER_SITE_OTHER): Payer: 59 | Admitting: Obstetrics and Gynecology

## 2020-10-17 ENCOUNTER — Other Ambulatory Visit: Payer: Self-pay

## 2020-10-17 VITALS — BP 110/80 | Ht 63.0 in | Wt 163.0 lb

## 2020-10-17 DIAGNOSIS — R102 Pelvic and perineal pain: Secondary | ICD-10-CM

## 2020-10-17 DIAGNOSIS — M899 Disorder of bone, unspecified: Secondary | ICD-10-CM | POA: Diagnosis not present

## 2020-10-17 LAB — POCT URINALYSIS DIPSTICK
Bilirubin, UA: NEGATIVE
Blood, UA: NEGATIVE
Glucose, UA: NEGATIVE
Ketones, UA: NEGATIVE
Leukocytes, UA: NEGATIVE
Nitrite, UA: NEGATIVE
Protein, UA: NEGATIVE
Spec Grav, UA: 1.02 (ref 1.010–1.025)
pH, UA: 5 (ref 5.0–8.0)

## 2020-10-17 NOTE — Patient Instructions (Signed)
I value your feedback and you entrusting us with your care. If you get a [facility] patient survey, I would appreciate you taking the time to let us know about your experience today. Thank you! ? ? ?

## 2020-11-02 ENCOUNTER — Encounter: Payer: Self-pay | Admitting: Obstetrics and Gynecology

## 2020-11-02 ENCOUNTER — Ambulatory Visit (INDEPENDENT_AMBULATORY_CARE_PROVIDER_SITE_OTHER): Payer: 59 | Admitting: Obstetrics and Gynecology

## 2020-11-02 ENCOUNTER — Other Ambulatory Visit: Payer: Self-pay

## 2020-11-02 VITALS — BP 102/70 | Ht 63.0 in | Wt 160.0 lb

## 2020-11-02 DIAGNOSIS — Z803 Family history of malignant neoplasm of breast: Secondary | ICD-10-CM | POA: Diagnosis not present

## 2020-11-02 DIAGNOSIS — Z01419 Encounter for gynecological examination (general) (routine) without abnormal findings: Secondary | ICD-10-CM | POA: Diagnosis not present

## 2020-11-02 DIAGNOSIS — Z1231 Encounter for screening mammogram for malignant neoplasm of breast: Secondary | ICD-10-CM | POA: Diagnosis not present

## 2020-11-02 DIAGNOSIS — Z9189 Other specified personal risk factors, not elsewhere classified: Secondary | ICD-10-CM

## 2020-11-02 DIAGNOSIS — A6009 Herpesviral infection of other urogenital tract: Secondary | ICD-10-CM

## 2020-11-02 MED ORDER — VALACYCLOVIR HCL 500 MG PO TABS: ORAL_TABLET | ORAL | 0 refills | Status: DC

## 2020-11-02 NOTE — Progress Notes (Signed)
PCP: [name], MD   Chief Complaint  Patient presents with  . Gynecologic Exam    No concerns    HPI:      [name] is a 55 y.o. [id number] who LMP was No LMP recorded. Patient is perimenopausal., presents today for her annual examination.  Her menses are absent due to menopause, LMP 11/20. She does have vasomotor sx that are tolerable.   Sex activity: single partner, contraception - vasectomy. She  have vaginal dryness, improved with lubricants.   Last Pap: 10/21/17 Results were: no abnormalities /neg HPV DNA.  Hx of STDs: HSV, takes valtrex prn sx  Last mammogram: 10/26/19  Results were: normal--routine follow-up in 12 months after addl views.  There is a FH of breast cancer in her mom, mat 1/2 sister and pat aunt. There is no FH of ovarian cancer. The patient does do self-breast exams. Pt is MyRisk neg 2017. IBIS=33%/riskscore=47%. She has not had a recent scr breast MRI; last one 5/14. She takes Vit D supp.   Colonoscopy: colonoscopy 4 years ago without abnormalities.  Repeat due after 10 years.   Tobacco use: The patient denies current or previous tobacco use. Alcohol use: none  No drug use Exercise: very active  She does get adequate calcium and Vitamin D in her diet.  Labs with PCP.  Pt with pubic bone and LT groin pain a few wks ago. Was improving then very active this wknd and now worse again. Saw ortho today, MRI ordered. Question pulled vs torn muscle. Pelvic pain resolved so pt canceled Gyn u/s, although MSK etiology suspected all along.   Past Medical History:  Diagnosis Date  . Anxiety   . BRCA negative 06/2016   MyRisk neg, MSH6 and RAD51C VUS  . Depression   . Eczema   . Family history of breast cancer   . Genital herpes   . Increased risk of breast cancer 06/2016   IBIS=33%/riskscore=47.4%  . Infertility, female   . PMDD (premenstrual dysphoric disorder)   . Stage 3 chronic kidney disease ([place])   . Stress incontinence   . Vitamin D  deficiency     Past Surgical History:  Procedure Laterality Date  . CLOSED REDUCTION NASAL FRACTURE Bilateral 04/10/2019   Procedure: CLOSED REDUCTION NASAL FRACTURE;  Surgeon: Melida Quitter, MD;  Location: [place];  Service: ENT;  Laterality: Bilateral;  . DILATION AND CURETTAGE OF UTERUS    . VAGINAL DELIVERY     x2    Family History  Problem Relation Age of Onset  . Colon polyps Father   . Kidney cancer Father 57  . Breast cancer Mother 81  . Osteoporosis Mother   . Breast cancer Sister 13       mat 1/2 sister, ? BRCA neg  . Breast cancer Paternal Aunt 12  . Colon cancer Neg Hx   . Esophageal cancer Neg Hx   . Rectal cancer Neg Hx   . Stomach cancer Neg Hx     Social History   Socioeconomic History  . Marital status: Married    Spouse name: Not on file  . Number of children: Not on file  . Years of education: Not on file  . Highest education level: Not on file  Occupational History  . Not on file  Tobacco Use  . Smoking status: Never Smoker  . Smokeless tobacco: Never Used  Vaping Use  . Vaping Use: Never used  Substance and Sexual Activity  .  Alcohol use: No  . Drug use: No  . Sexual activity: Yes    Birth control/protection: None  Other Topics Concern  . Not on file  Social History Narrative  . Not on file   Social Determinants of Health   Financial Resource Strain: Not on file  Food Insecurity: Not on file  Transportation Needs: Not on file  Physical Activity: Not on file  Stress: Not on file  Social Connections: Not on file  Intimate Partner Violence: Not on file    Outpatient Medications Prior to Visit  Medication Sig Dispense Refill  . buPROPion (WELLBUTRIN XL) 300 MG 24 hr tablet TK 1 T PO ONCE D  1  . cholecalciferol (VITAMIN D) 1000 units tablet Take 1,000 Units by mouth daily.    . valACYclovir (VALTREX) 500 MG tablet TAKE 1 TABLET BY MOUTH TWICE DAILY FOR 3 DAYS AS NEEDED FOR SYMPTOMS 30 tablet 0  .  clotrimazole-betamethasone (LOTRISONE) cream Apply externally BID prn sx up to 2 wks (Patient not taking: Reported on 10/17/2020) 15 g 0  . ibuprofen (ADVIL) 600 MG tablet Take 600 mg by mouth every 6 (six) hours as needed. (Patient not taking: No sig reported)     Facility-Administered Medications Prior to Visit  Medication Dose Route Frequency Provider Last Rate Last Admin  . 0.9 %  sodium chloride infusion  500 mL Intravenous Continuous Nandigam, Kavitha V, MD       ROS:  Review of Systems  Constitutional: Negative for fatigue, fever and unexpected weight change.  Respiratory: Negative for cough, shortness of breath and wheezing.   Cardiovascular: Negative for chest pain, palpitations and leg swelling.  Gastrointestinal: Negative for blood in stool, constipation, diarrhea, nausea and vomiting.  Endocrine: Negative for cold intolerance, heat intolerance and polyuria.  Genitourinary: Negative for dyspareunia, dysuria, flank pain, frequency, genital sores, hematuria, menstrual problem, pelvic pain, urgency, vaginal bleeding, vaginal discharge and vaginal pain.  Musculoskeletal: Positive for myalgias. Negative for back pain and joint swelling.  Skin: Negative for rash.  Neurological: Negative for dizziness, syncope, light-headedness, numbness and headaches.  Hematological: Negative for adenopathy.  Psychiatric/Behavioral: Negative for agitation, confusion, dysphoric mood, sleep disturbance and suicidal ideas. The patient is not nervous/anxious.   BREAST: No symptoms   Objective: BP 102/70   Ht _0  (1.6 m)   Wt 160 lb (72.6 kg)   BMI 28.34 kg/m    Physical Exam Constitutional:      Appearance: She is well-developed.  Genitourinary:     Vulva normal.     Right Labia: No rash, tenderness or lesions.    Left Labia: No tenderness, lesions or rash.    No vaginal discharge, erythema or tenderness.      Right Adnexa: not tender and no mass present.    Left Adnexa: not tender and no  mass present.    No cervical motion tenderness, friability or polyp.     Uterus is not enlarged or tender.  Breasts:     Right: No mass, nipple discharge, skin change or tenderness.     Left: No mass, nipple discharge, skin change or tenderness.    Neck:     Thyroid: No thyromegaly.  Cardiovascular:     Rate and Rhythm: Normal rate and regular rhythm.     Heart sounds: Normal heart sounds. No murmur heard.   Pulmonary:     Effort: Pulmonary effort is normal.     Breath sounds: Normal breath sounds.  Abdominal:     Palpations:  Abdomen is soft.     Tenderness: There is no abdominal tenderness. There is no guarding or rebound.  Musculoskeletal:        General: Normal range of motion.     Cervical back: Normal range of motion.  Lymphadenopathy:     Cervical: No cervical adenopathy.  Neurological:     General: No focal deficit present.     Mental Status: She is alert and oriented to person, place, and time.     Cranial Nerves: No cranial nerve deficit.  Skin:    General: Skin is warm and dry.  Psychiatric:        Mood and Affect: Mood normal.        Behavior: Behavior normal.        Thought Content: Thought content normal.        Judgment: Judgment normal.  Vitals reviewed.     Assessment/Plan:  Encounter for annual routine gynecological examination  Encounter for screening mammogram for malignant neoplasm of breast - Plan: MM 3D SCREEN BREAST BILATERAL; pt to sched mammo  Family history of breast cancer--MyRisk neg  Increased risk of breast cancer--pt aware of monthly SBE, yearly CBE and mammos, as well as scr breast MRI. Pt will call for self pay MRI in a few months after mammo. Cont Vit D supp.   Herpes genitalis in women - Plan: valACYclovir (VALTREX) 500 MG tablet; Rx RF.   Meds ordered this encounter  Medications  . valACYclovir (VALTREX) 500 MG tablet    Sig: TAKE 1 TABLET BY MOUTH TWICE DAILY FOR 3 DAYS AS NEEDED FOR SYMPTOMS    Dispense:  30 tablet     Refill:  0    Order Specific Question:   Supervising Provider    Answer:   Gae Dry [[id number]]          GYN counsel breast self exam, mammography screening, menopause, adequate intake of calcium and vitamin D, diet and exercise    F/U  Return in about 1 year (around 11/02/2021).  Lindzey Zent B. Anni Hocevar, PA-C 11/02/2020 3:37 PM

## 2020-11-02 NOTE — Patient Instructions (Addendum)
I value your feedback and you entrusting us with your care. If you get a [facility] patient survey, I would appreciate you taking the time to let us know about your experience today. Thank you!  [facility]: [phone]      

## 2021-01-05 ENCOUNTER — Ambulatory Visit
Admission: RE | Admit: 2021-01-05 | Discharge: 2021-01-05 | Disposition: A | Discharge status: Home / Self Care | Payer: 59 | Source: Ambulatory Visit | Attending: Obstetrics and Gynecology | Admitting: Obstetrics and Gynecology

## 2021-01-05 ENCOUNTER — Other Ambulatory Visit: Payer: Self-pay

## 2021-01-05 DIAGNOSIS — Z1231 Encounter for screening mammogram for malignant neoplasm of breast: Secondary | ICD-10-CM

## 2021-01-06 ENCOUNTER — Encounter: Payer: Self-pay | Admitting: Obstetrics and Gynecology

## 2021-01-09 ENCOUNTER — Other Ambulatory Visit: Payer: Self-pay | Admitting: Obstetrics and Gynecology

## 2021-01-09 DIAGNOSIS — R928 Other abnormal and inconclusive findings on diagnostic imaging of breast: Secondary | ICD-10-CM

## 2021-01-12 ENCOUNTER — Other Ambulatory Visit: Payer: Self-pay

## 2021-01-12 ENCOUNTER — Ambulatory Visit: Payer: 59

## 2021-01-12 ENCOUNTER — Encounter: Payer: Self-pay | Admitting: Obstetrics and Gynecology

## 2021-01-12 ENCOUNTER — Ambulatory Visit
Admission: RE | Admit: 2021-01-12 | Discharge: 2021-01-12 | Disposition: A | Discharge status: Home / Self Care | Payer: 59 | Source: Ambulatory Visit | Attending: Obstetrics and Gynecology | Admitting: Obstetrics and Gynecology

## 2021-01-12 DIAGNOSIS — R928 Other abnormal and inconclusive findings on diagnostic imaging of breast: Secondary | ICD-10-CM

## 2021-02-26 ENCOUNTER — Encounter: Payer: Self-pay | Admitting: Obstetrics and Gynecology

## 2021-06-15 DIAGNOSIS — Z23 Encounter for immunization: Secondary | ICD-10-CM | POA: Diagnosis not present

## 2021-07-07 DIAGNOSIS — R350 Frequency of micturition: Secondary | ICD-10-CM | POA: Diagnosis not present

## 2021-07-12 ENCOUNTER — Encounter: Payer: Self-pay | Admitting: Obstetrics and Gynecology

## 2021-07-12 DIAGNOSIS — Z1239 Encounter for other screening for malignant neoplasm of breast: Secondary | ICD-10-CM

## 2021-07-12 DIAGNOSIS — Z803 Family history of malignant neoplasm of breast: Secondary | ICD-10-CM

## 2021-07-12 DIAGNOSIS — R922 Inconclusive mammogram: Secondary | ICD-10-CM

## 2021-07-12 DIAGNOSIS — Z9189 Other specified personal risk factors, not elsewhere classified: Secondary | ICD-10-CM

## 2021-07-20 ENCOUNTER — Other Ambulatory Visit: Payer: Self-pay

## 2021-07-20 ENCOUNTER — Ambulatory Visit
Admission: RE | Admit: 2021-07-20 | Discharge: 2021-07-20 | Disposition: A | Discharge status: Home / Self Care | Payer: No Typology Code available for payment source | Source: Ambulatory Visit | Attending: Obstetrics and Gynecology | Admitting: Obstetrics and Gynecology

## 2021-07-20 DIAGNOSIS — Z803 Family history of malignant neoplasm of breast: Secondary | ICD-10-CM

## 2021-07-20 DIAGNOSIS — Z1239 Encounter for other screening for malignant neoplasm of breast: Secondary | ICD-10-CM

## 2021-07-20 DIAGNOSIS — Z9189 Other specified personal risk factors, not elsewhere classified: Secondary | ICD-10-CM

## 2021-07-20 DIAGNOSIS — R922 Inconclusive mammogram: Secondary | ICD-10-CM

## 2021-07-20 MED ORDER — GADOBUTROL 1 MMOL/ML IV SOLN
8.0000 mL | Freq: Once | INTRAVENOUS | Status: AC | PRN
  Administered 2021-07-20: 11:00:00 8 mL via INTRAVENOUS

## 2021-08-30 DIAGNOSIS — E559 Vitamin D deficiency, unspecified: Secondary | ICD-10-CM | POA: Diagnosis not present

## 2021-08-30 DIAGNOSIS — N1831 Chronic kidney disease, stage 3a: Secondary | ICD-10-CM | POA: Diagnosis not present

## 2021-09-11 ENCOUNTER — Encounter: Payer: Self-pay | Admitting: Obstetrics and Gynecology

## 2021-09-19 DIAGNOSIS — Z1331 Encounter for screening for depression: Secondary | ICD-10-CM | POA: Diagnosis not present

## 2021-09-19 DIAGNOSIS — Z0000 Encounter for general adult medical examination without abnormal findings: Secondary | ICD-10-CM | POA: Diagnosis not present

## 2021-09-19 DIAGNOSIS — N1831 Chronic kidney disease, stage 3a: Secondary | ICD-10-CM | POA: Diagnosis not present

## 2021-09-19 DIAGNOSIS — Z1339 Encounter for screening examination for other mental health and behavioral disorders: Secondary | ICD-10-CM | POA: Diagnosis not present

## 2021-11-14 ENCOUNTER — Ambulatory Visit: Payer: 59 | Admitting: Obstetrics and Gynecology

## 2021-11-14 NOTE — Progress Notes (Signed)
? ?PCP: [name], MD ? ? ?Chief Complaint  ?Patient presents with  ? Gynecologic Exam  ?  No concerns  ? ? ?HPI: ?     Betty Ross is a 56 y.o. [id number] who LMP was No LMP recorded. Patient is perimenopausal., presents today for her annual examination.  Her menses are absent due to menopause, LMP 11/20. She does have vasomotor sx that are tolerable. No PMB. ? ?Pt having issues with anxiety, stress management, "burnout" wt gain, trouble sleeping. Sx worse during tax season but trying to "reset" and wonders if hormones contributory. Has trouble falling asleep some nights and may be up to 3 AM. Melatonin without relief.  ? ?Sex activity: single partner, contraception - vasectomy. She does  have vaginal dryness, improved with lubricants.  ? ?Last Pap: 10/21/17 Results were: no abnormalities /neg HPV DNA.  ?Hx of STDs: HSV, takes valtrex prn sx ? ?Last mammogram: 01/12/21  Results were: normal after addl imaging LT breast--routine follow-up in 12 months. Screening breast MRI was normal 12/22. ?There is a FH of breast cancer in her mom, mat 1/2 sister and pat aunt. There is no FH of ovarian cancer. The patient does do self-breast exams. Pt is MyRisk neg 2017. IBIS=33%/riskscore=47%.  She takes Vit D supp.  ? ?Colonoscopy: colonoscopy 5 years ago without abnormalities.  Repeat due after 10 years.  ? ?Tobacco use: The patient denies current or previous tobacco use. ?Alcohol use: none  ?No drug use ?Exercise: very active ? ?She does get adequate calcium and Vitamin D in her diet. ? ?Labs with PCP. ? ?Past Medical History:  ?Diagnosis Date  ? Anxiety   ? BRCA negative 06/2016  ? MyRisk neg, MSH6 and RAD51C VUS  ? Depression   ? Eczema   ? Family history of breast cancer   ? Genital herpes   ? Increased risk of breast cancer 06/2016  ? IBIS=33%/riskscore=47.4%  ? Infertility, female   ? PMDD (premenstrual dysphoric disorder)   ? Stage 3 chronic kidney disease ([place])   ? Stress incontinence   ? Vitamin D deficiency    ? ? ?Past Surgical History:  ?Procedure Laterality Date  ? CLOSED REDUCTION NASAL FRACTURE Bilateral 04/10/2019  ? Procedure: CLOSED REDUCTION NASAL FRACTURE;  Surgeon: Melida Quitter, MD;  Location: [place];  Service: ENT;  Laterality: Bilateral;  ? DILATION AND CURETTAGE OF UTERUS    ? VAGINAL DELIVERY    ? x2  ? ? ?Family History  ?Problem Relation Age of Onset  ? Colon polyps Father   ? Kidney cancer Father 27  ? Breast cancer Mother 39  ? Osteoporosis Mother   ? Breast cancer Sister 69  ?     mat 1/2 sister, ? BRCA neg  ? Breast cancer Paternal Aunt 35  ? Colon cancer Neg Hx   ? Esophageal cancer Neg Hx   ? Rectal cancer Neg Hx   ? Stomach cancer Neg Hx   ? ? ?Social History  ? ?Socioeconomic History  ? Marital status: Married  ?  Spouse name: Not on file  ? Number of children: Not on file  ? Years of education: Not on file  ? Highest education level: Not on file  ?Occupational History  ? Not on file  ?Tobacco Use  ? Smoking status: Never  ? Smokeless tobacco: Never  ?Vaping Use  ? Vaping Use: Never used  ?Substance and Sexual Activity  ? Alcohol use: No  ? Drug use:  No  ? Sexual activity: Yes  ?  Birth control/protection: None  ?Other Topics Concern  ? Not on file  ?Social History Narrative  ? Not on file  ? ?Social Determinants of Health  ? ?Financial Resource Strain: Not on file  ?Food Insecurity: Not on file  ?Transportation Needs: Not on file  ?Physical Activity: Not on file  ?Stress: Not on file  ?Social Connections: Not on file  ?Intimate Partner Violence: Not on file  ? ? ?Outpatient Medications Prior to Visit  ?Medication Sig Dispense Refill  ? cholecalciferol (VITAMIN D) 1000 units tablet Take 1,000 Units by mouth daily.    ? valACYclovir (VALTREX) 500 MG tablet TAKE 1 TABLET BY MOUTH TWICE DAILY FOR 3 DAYS AS NEEDED FOR SYMPTOMS 30 tablet 0  ? buPROPion (WELLBUTRIN XL) 300 MG 24 hr tablet TK 1 T PO ONCE D  1  ? ?Facility-Administered Medications Prior to Visit  ?Medication Dose Route  Frequency Provider Last Rate Last Admin  ? 0.9 %  sodium chloride infusion  500 mL Intravenous Continuous Nandigam, Venia Minks, MD      ? ?ROS: ? ?Review of Systems  ?Constitutional:  Negative for fatigue, fever and unexpected weight change.  ?Respiratory:  Negative for cough, shortness of breath and wheezing.   ?Cardiovascular:  Negative for chest pain, palpitations and leg swelling.  ?Gastrointestinal:  Negative for blood in stool, constipation, diarrhea, nausea and vomiting.  ?Endocrine: Negative for cold intolerance, heat intolerance and polyuria.  ?Genitourinary:  Negative for dyspareunia, dysuria, flank pain, frequency, genital sores, hematuria, menstrual problem, pelvic pain, urgency, vaginal bleeding, vaginal discharge and vaginal pain.  ?Musculoskeletal:  Positive for myalgias. Negative for back pain and joint swelling.  ?Skin:  Negative for rash.  ?Neurological:  Negative for dizziness, syncope, light-headedness, numbness and headaches.  ?Hematological:  Negative for adenopathy.  ?Psychiatric/Behavioral:  Positive for agitation. Negative for confusion, dysphoric mood, sleep disturbance and suicidal ideas. The patient is not nervous/anxious.  BREAST: No symptoms ? ? ?Objective: ?BP 98/70   Ht _0  (1.6 m)   Wt 170 lb (77.1 kg)   BMI 30.11 kg/m?  ? ? ?Physical Exam ?Constitutional:   ?   Appearance: She is well-developed.  ?Genitourinary:  ?   Vulva normal.  ?   Right Labia: No rash, tenderness or lesions. ?   Left Labia: No tenderness, lesions or rash. ?   No vaginal discharge, erythema or tenderness.  ? ?   Right Adnexa: not tender and no mass present. ?   Left Adnexa: not tender and no mass present. ?   No cervical motion tenderness, friability or polyp.  ?   Uterus is not enlarged or tender.  ?Breasts: ?   Right: No mass, nipple discharge, skin change or tenderness.  ?   Left: No mass, nipple discharge, skin change or tenderness.  ?Neck:  ?   Thyroid: No thyromegaly.  ?Cardiovascular:  ?   Rate and  Rhythm: Normal rate and regular rhythm.  ?   Heart sounds: Normal heart sounds. No murmur heard. ?Pulmonary:  ?   Effort: Pulmonary effort is normal.  ?   Breath sounds: Normal breath sounds.  ?Abdominal:  ?   Palpations: Abdomen is soft.  ?   Tenderness: There is no abdominal tenderness. There is no guarding or rebound.  ?Musculoskeletal:     ?   General: Normal range of motion.  ?   Cervical back: Normal range of motion.  ?Lymphadenopathy:  ?   Cervical:  No cervical adenopathy.  ?Neurological:  ?   General: No focal deficit present.  ?   Mental Status: She is alert and oriented to person, place, and time.  ?   Cranial Nerves: No cranial nerve deficit.  ?Skin: ?   General: Skin is warm and dry.  ?Psychiatric:     ?   Mood and Affect: Mood normal.     ?   Behavior: Behavior normal.     ?   Thought Content: Thought content normal.     ?   Judgment: Judgment normal.  ?Vitals reviewed.  ? ? ?Assessment/Plan: ?Encounter for annual routine gynecological examination ? ?Encounter for screening mammogram for malignant neoplasm of breast - Plan: MM 3D SCREEN BREAST BILATERAL ? ?Family history of breast cancer--MyRisk neg ? ?Increased risk of breast cancer--pt aware of monthly SBE, yearly CBE and mammos, as well as scr breast MRI. Pt will call for self pay MRI in a few months after mammo. Cont Vit D supp.  ? ?Herpes genitalis in women - Plan: valACYclovir (VALTREX) 500 MG tablet; Rx RF ? ?Stress reaction--discussed trying to increase sleep for sx improvement, stress mgmt, therapist/counselor. Can try unisom for sleep, if no relief can do Rx trazodone. ? ?Meds ordered this encounter  ?Medications  ? valACYclovir (VALTREX) 500 MG tablet  ?  Sig: TAKE 1 TABLET BY MOUTH TWICE DAILY FOR 3 DAYS AS NEEDED FOR SYMPTOMS  ?  Dispense:  30 tablet  ?  Refill:  0  ?  Order Specific Question:   Supervising Provider  ?  Answer:   CONSTANT, PEGGY [[id number]]  ? ?       ?GYN counsel breast self exam, mammography screening, menopause, adequate  intake of calcium and vitamin D, diet and exercise ? ?  F/U ? Return in about 1 year (around 11/16/2022). ? ?Betty Massing B. Lea Walbert, PA-C ?11/15/2021 ?5:12 PM ?

## 2021-11-15 ENCOUNTER — Encounter: Payer: Self-pay | Admitting: Obstetrics and Gynecology

## 2021-11-15 ENCOUNTER — Ambulatory Visit (INDEPENDENT_AMBULATORY_CARE_PROVIDER_SITE_OTHER): Payer: BC Managed Care – PPO | Admitting: Obstetrics and Gynecology

## 2021-11-15 VITALS — BP 98/70 | Ht 63.0 in | Wt 170.0 lb

## 2021-11-15 DIAGNOSIS — Z01419 Encounter for gynecological examination (general) (routine) without abnormal findings: Secondary | ICD-10-CM | POA: Diagnosis not present

## 2021-11-15 DIAGNOSIS — Z803 Family history of malignant neoplasm of breast: Secondary | ICD-10-CM | POA: Diagnosis not present

## 2021-11-15 DIAGNOSIS — A6009 Herpesviral infection of other urogenital tract: Secondary | ICD-10-CM

## 2021-11-15 DIAGNOSIS — Z9189 Other specified personal risk factors, not elsewhere classified: Secondary | ICD-10-CM

## 2021-11-15 DIAGNOSIS — Z1231 Encounter for screening mammogram for malignant neoplasm of breast: Secondary | ICD-10-CM

## 2021-11-15 DIAGNOSIS — F430 Acute stress reaction: Secondary | ICD-10-CM

## 2021-11-15 MED ORDER — VALACYCLOVIR HCL 500 MG PO TABS: ORAL_TABLET | ORAL | 0 refills | Status: DC

## 2021-11-15 NOTE — Patient Instructions (Addendum)
I value your feedback and you entrusting us with your care. If you get a [facility] patient survey, I would appreciate you taking the time to let us know about your experience today. Thank you!  [facility]: [phone]      

## 2022-01-11 ENCOUNTER — Ambulatory Visit
Admission: RE | Admit: 2022-01-11 | Discharge: 2022-01-11 | Disposition: A | Discharge status: Home / Self Care | Payer: BC Managed Care – PPO | Source: Ambulatory Visit | Attending: Obstetrics and Gynecology | Admitting: Obstetrics and Gynecology

## 2022-01-11 DIAGNOSIS — Z803 Family history of malignant neoplasm of breast: Secondary | ICD-10-CM

## 2022-01-11 DIAGNOSIS — Z9189 Other specified personal risk factors, not elsewhere classified: Secondary | ICD-10-CM

## 2022-01-11 DIAGNOSIS — Z1231 Encounter for screening mammogram for malignant neoplasm of breast: Secondary | ICD-10-CM

## 2022-01-12 ENCOUNTER — Encounter: Payer: Self-pay | Admitting: Obstetrics and Gynecology

## 2022-01-12 DIAGNOSIS — Z803 Family history of malignant neoplasm of breast: Secondary | ICD-10-CM

## 2022-01-12 DIAGNOSIS — Z9189 Other specified personal risk factors, not elsewhere classified: Secondary | ICD-10-CM

## 2022-01-12 DIAGNOSIS — Z1239 Encounter for other screening for malignant neoplasm of breast: Secondary | ICD-10-CM

## 2022-02-15 NOTE — Telephone Encounter (Signed)
Can you pls call [place] and ask about what pt is talking about with self pay rate? They told us at one time to use different order and would only pay $400. Thx

## 2022-03-05 DIAGNOSIS — D2271 Melanocytic nevi of right lower limb, including hip: Secondary | ICD-10-CM | POA: Diagnosis not present

## 2022-03-05 DIAGNOSIS — D225 Melanocytic nevi of trunk: Secondary | ICD-10-CM | POA: Diagnosis not present

## 2022-03-05 DIAGNOSIS — D2272 Melanocytic nevi of left lower limb, including hip: Secondary | ICD-10-CM | POA: Diagnosis not present

## 2022-03-05 DIAGNOSIS — L814 Other melanin hyperpigmentation: Secondary | ICD-10-CM | POA: Diagnosis not present

## 2022-03-07 DIAGNOSIS — M25561 Pain in right knee: Secondary | ICD-10-CM | POA: Diagnosis not present

## 2022-05-05 DIAGNOSIS — Z23 Encounter for immunization: Secondary | ICD-10-CM | POA: Diagnosis not present

## 2022-05-24 DIAGNOSIS — N1831 Chronic kidney disease, stage 3a: Secondary | ICD-10-CM | POA: Diagnosis not present

## 2022-05-24 DIAGNOSIS — R1032 Left lower quadrant pain: Secondary | ICD-10-CM | POA: Diagnosis not present

## 2022-06-28 ENCOUNTER — Telehealth: Payer: Self-pay | Admitting: Obstetrics and Gynecology

## 2022-06-28 NOTE — Telephone Encounter (Signed)
Pt's insurance denied authorization for breast MRI initially and appeal through [place]. [facility] stated we had to do another appeal through [employer]. Spoke with [employer] who stated I could not do peer to peer and gave me process for appeal. Pt's MRI would then need to be postponed until appeal processed. Pt needs MRI by 07/12/22.   Spoke with pt. She hasn't met deductible anyway and doesn't want to reschedule. [facility] also will do self-pay rate if insurance denies auth, so pt prefers that it's denied and doesn't want me to continue with appeal process.

## 2022-06-29 ENCOUNTER — Ambulatory Visit
Admission: RE | Admit: 2022-06-29 | Discharge: 2022-06-29 | Disposition: A | Discharge status: Home / Self Care | Payer: No Typology Code available for payment source | Source: Ambulatory Visit | Attending: Obstetrics and Gynecology | Admitting: Obstetrics and Gynecology

## 2022-06-29 DIAGNOSIS — Z1239 Encounter for other screening for malignant neoplasm of breast: Secondary | ICD-10-CM

## 2022-06-29 DIAGNOSIS — Z9189 Other specified personal risk factors, not elsewhere classified: Secondary | ICD-10-CM

## 2022-06-29 DIAGNOSIS — Z803 Family history of malignant neoplasm of breast: Secondary | ICD-10-CM | POA: Diagnosis not present

## 2022-06-29 MED ORDER — GADOPICLENOL 0.5 MMOL/ML IV SOLN
7.0000 mL | Freq: Once | INTRAVENOUS | Status: AC | PRN
  Administered 2022-06-29: 7 mL via INTRAVENOUS

## 2022-09-03 DIAGNOSIS — H52203 Unspecified astigmatism, bilateral: Secondary | ICD-10-CM | POA: Diagnosis not present

## 2022-09-03 DIAGNOSIS — H04123 Dry eye syndrome of bilateral lacrimal glands: Secondary | ICD-10-CM | POA: Diagnosis not present

## 2022-09-03 DIAGNOSIS — H5203 Hypermetropia, bilateral: Secondary | ICD-10-CM | POA: Diagnosis not present

## 2022-09-17 DIAGNOSIS — E559 Vitamin D deficiency, unspecified: Secondary | ICD-10-CM | POA: Diagnosis not present

## 2022-09-17 DIAGNOSIS — N1831 Chronic kidney disease, stage 3a: Secondary | ICD-10-CM | POA: Diagnosis not present

## 2022-10-01 DIAGNOSIS — Z0000 Encounter for general adult medical examination without abnormal findings: Secondary | ICD-10-CM | POA: Diagnosis not present

## 2022-10-01 DIAGNOSIS — Z1331 Encounter for screening for depression: Secondary | ICD-10-CM | POA: Diagnosis not present

## 2022-10-01 DIAGNOSIS — M25562 Pain in left knee: Secondary | ICD-10-CM | POA: Diagnosis not present

## 2022-10-01 DIAGNOSIS — F330 Major depressive disorder, recurrent, mild: Secondary | ICD-10-CM | POA: Diagnosis not present

## 2022-10-01 DIAGNOSIS — N1831 Chronic kidney disease, stage 3a: Secondary | ICD-10-CM | POA: Diagnosis not present

## 2022-10-01 DIAGNOSIS — D72819 Decreased white blood cell count, unspecified: Secondary | ICD-10-CM | POA: Diagnosis not present

## 2022-10-01 DIAGNOSIS — Z1339 Encounter for screening examination for other mental health and behavioral disorders: Secondary | ICD-10-CM | POA: Diagnosis not present

## 2022-10-01 DIAGNOSIS — E559 Vitamin D deficiency, unspecified: Secondary | ICD-10-CM | POA: Diagnosis not present

## 2022-10-01 DIAGNOSIS — G4709 Other insomnia: Secondary | ICD-10-CM | POA: Diagnosis not present

## 2022-11-19 ENCOUNTER — Other Ambulatory Visit: Payer: Self-pay | Admitting: Family Medicine

## 2022-11-19 DIAGNOSIS — R1032 Left lower quadrant pain: Secondary | ICD-10-CM | POA: Diagnosis not present

## 2022-11-19 DIAGNOSIS — N1831 Chronic kidney disease, stage 3a: Secondary | ICD-10-CM | POA: Diagnosis not present

## 2022-11-21 DIAGNOSIS — H903 Sensorineural hearing loss, bilateral: Secondary | ICD-10-CM | POA: Diagnosis not present

## 2022-11-23 ENCOUNTER — Emergency Department (HOSPITAL_BASED_OUTPATIENT_CLINIC_OR_DEPARTMENT_OTHER)
Admission: EM | Admit: 2022-11-23 | Discharge: 2022-11-23 | Disposition: A | Discharge status: Home / Self Care | Payer: BC Managed Care – PPO | Attending: Emergency Medicine | Admitting: Emergency Medicine

## 2022-11-23 ENCOUNTER — Other Ambulatory Visit: Payer: Self-pay

## 2022-11-23 ENCOUNTER — Encounter (HOSPITAL_BASED_OUTPATIENT_CLINIC_OR_DEPARTMENT_OTHER): Payer: Self-pay

## 2022-11-23 DIAGNOSIS — X58XXXA Exposure to other specified factors, initial encounter: Secondary | ICD-10-CM | POA: Diagnosis not present

## 2022-11-23 DIAGNOSIS — H5712 Ocular pain, left eye: Secondary | ICD-10-CM | POA: Diagnosis not present

## 2022-11-23 DIAGNOSIS — N183 Chronic kidney disease, stage 3 unspecified: Secondary | ICD-10-CM | POA: Insufficient documentation

## 2022-11-23 DIAGNOSIS — S0502XA Injury of conjunctiva and corneal abrasion without foreign body, left eye, initial encounter: Secondary | ICD-10-CM | POA: Diagnosis not present

## 2022-11-23 MED ORDER — GENTAMICIN SULFATE 0.3 % OP SOLN: 2.0000 [drp] | OPHTHALMIC | 0 refills | Status: AC

## 2022-11-23 MED ORDER — FLUORESCEIN SODIUM 1 MG OP STRP
1.0000 | ORAL_STRIP | Freq: Once | OPHTHALMIC | Status: AC
  Administered 2022-11-23: 1 via OPHTHALMIC
  Filled 2022-11-23: qty 1

## 2022-11-23 NOTE — ED Provider Notes (Signed)
[place] EMERGENCY DEPARTMENT AT [facility] Provider Note  CSN: [id number] Arrival date & time: 11/23/22 0210  Chief Complaint(s) Eye Pain and Foreign Body in Eye (Patient states has contact stuck in her left eye. )  HPI Betty Ross is a 57 y.o. female here for possible retained contact lens in left eye.   Patient was able to remove the lens on the right w/o complication, but could not remove the one on the left. She attempted to grasp what she thought was the lens, but it would not pull away from her eye. She began have eye discomfort and redness, prompting her visit.   The history is provided by the patient.    Past Medical History Past Medical History:  Diagnosis Date   Anxiety    BRCA negative 06/2016   MyRisk neg, MSH6 and RAD51C VUS   Depression    Eczema    Family history of breast cancer    Genital herpes    Increased risk of breast cancer 06/2016   IBIS=33%/riskscore=47.4%   Infertility, female    PMDD (premenstrual dysphoric disorder)    Stage 3 chronic kidney disease (HCC)    Stress incontinence    Vitamin D deficiency    Patient Active Problem List   Diagnosis Date Noted   Stress incontinence    PMDD (premenstrual dysphoric disorder)    Herpes    Depression    Anxiety    Increased risk of breast cancer 06/29/2016   Home Medication(s) Prior to Admission medications   Medication Sig Start Date End Date Taking? Authorizing Provider  gentamicin (GARAMYCIN) 0.3 % ophthalmic solution Place 2 drops into the left eye every 4 (four) hours for 5 days. 11/23/22 11/28/22 Yes Saleemah Mollenhauer, Amadeo Garnet, MD  cholecalciferol (VITAMIN D) 1000 units tablet Take 1,000 Units by mouth daily.    [provider]  valACYclovir (VALTREX) 500 MG tablet TAKE 1 TABLET BY MOUTH TWICE DAILY FOR 3 DAYS AS NEEDED FOR SYMPTOMS 11/15/21   Copland, Helmut Muster B, PA-C                                                                                                                                     Allergies Sulfa antibiotics and Penicillins  Review of Systems Review of Systems As noted in HPI  Physical Exam Vital Signs  I have reviewed the triage vital signs BP 136/84 (BP Location: Right Arm)   Pulse (!) 56   Temp 97.9 F (36.6 C) (Oral)   Resp 16   Ht 5\' 2"  (1.575 m)   Wt 66.7 kg   SpO2 100%   BMI 26.89 kg/m   Physical Exam Vitals reviewed.  Constitutional:      General: She is not in acute distress.    Appearance: She is well-developed. She is not diaphoretic.  HENT:     Head: Normocephalic and atraumatic.     Right Ear: External  ear normal.     Left Ear: External ear normal.     Nose: Nose normal.  Eyes:     General: Lids are everted, no foreign bodies appreciated. No scleral icterus.       Left eye: No foreign body, discharge or hordeolum.     Extraocular Movements: Extraocular movements intact.     Conjunctiva/sclera:     Right eye: Right conjunctiva is not injected. No chemosis, exudate or hemorrhage.    Left eye: Left conjunctiva is injected. No chemosis, exudate or hemorrhage.    Pupils:     Right eye: Pupil is round and reactive. No corneal abrasion.     Left eye: Pupil is round and reactive. Fluorescein uptake (to conjunctiva adjacent to iris at 3 o'clock) present. No corneal abrasion.  Neck:     Trachea: Phonation normal.  Cardiovascular:     Rate and Rhythm: Regular rhythm.  Pulmonary:     Effort: Pulmonary effort is normal. No respiratory distress.     Breath sounds: No stridor.  Abdominal:     General: There is no distension.  Musculoskeletal:        General: Normal range of motion.     Cervical back: Normal range of motion.  Neurological:     Mental Status: She is alert and oriented to person, place, and time.  Psychiatric:        Behavior: Behavior normal.     ED Results and Treatments Labs (all labs ordered are listed, but only abnormal results are displayed) Labs Reviewed - No data to display                                                                                                                        EKG  EKG Interpretation  Date/Time:    Ventricular Rate:    PR Interval:    QRS Duration:   QT Interval:    QTC Calculation:   R Axis:     Text Interpretation:         Radiology No results found.  Medications Ordered in ED Medications  fluorescein ophthalmic strip 1 strip (1 strip Left Eye Given 11/23/22 0341)                                                                                                                                     Procedures Procedures  (including critical care time)  Medical Decision Making / ED Course  Click here for ABCD2, HEART and other calculators  Medical Decision Making Risk Prescription drug management.    No retained contact lens noted on exam. It appears that patient was pinching conjunctiva, thinking it was her contact lens. Exam show possible conjunctival abrasion given fluorescein uptake. No corneal abrasion noted. Rx'd Gentamicin drops. Close f/u with ophtho/optomitrist recommended.     Final Clinical Impression(s) / ED Diagnoses Final diagnoses:  Conjunctival abrasion, left, initial encounter   The patient appears reasonably screened and/or stabilized for discharge and I doubt any other medical condition or other [facility] requiring further screening, evaluation, or treatment in the ED at this time. I have discussed the findings, Dx and Tx plan with the patient/family who expressed understanding and agree(s) with the plan. Discharge instructions discussed at length. The patient/family was given strict return precautions who verbalized understanding of the instructions. No further questions at time of discharge.  Disposition: Discharge  Condition: Good  ED Discharge Orders          Ordered    gentamicin (GARAMYCIN) 0.3 % ophthalmic solution  Every 4 hours        11/23/22 0430                     This chart was  dictated using voice recognition software.  Despite best efforts to proofread,  errors can occur which can change the documentation meaning.    Nira Conn, MD 11/23/22 [phone]

## 2022-11-23 NOTE — Discharge Instructions (Signed)
Please see your eye doctor for examination and approval of antibiotic drops.

## 2022-11-23 NOTE — ED Triage Notes (Signed)
Left eye with foreign body, patient states contact stuck in her eye. Red irritated

## 2022-12-10 ENCOUNTER — Other Ambulatory Visit: Payer: Self-pay | Admitting: Obstetrics and Gynecology

## 2022-12-10 DIAGNOSIS — Z1231 Encounter for screening mammogram for malignant neoplasm of breast: Secondary | ICD-10-CM

## 2022-12-20 ENCOUNTER — Ambulatory Visit
Admission: RE | Admit: 2022-12-20 | Discharge: 2022-12-20 | Disposition: A | Discharge status: Home / Self Care | Payer: BC Managed Care – PPO | Source: Ambulatory Visit | Attending: Family Medicine | Admitting: Family Medicine

## 2022-12-20 DIAGNOSIS — R1032 Left lower quadrant pain: Secondary | ICD-10-CM | POA: Diagnosis not present

## 2022-12-21 ENCOUNTER — Other Ambulatory Visit: Payer: BC Managed Care – PPO

## 2023-01-14 DIAGNOSIS — H903 Sensorineural hearing loss, bilateral: Secondary | ICD-10-CM | POA: Diagnosis not present

## 2023-01-17 ENCOUNTER — Ambulatory Visit
Admission: RE | Admit: 2023-01-17 | Discharge: 2023-01-17 | Disposition: A | Discharge status: Home / Self Care | Payer: BC Managed Care – PPO | Source: Ambulatory Visit

## 2023-01-17 DIAGNOSIS — Z1231 Encounter for screening mammogram for malignant neoplasm of breast: Secondary | ICD-10-CM

## 2023-02-13 DIAGNOSIS — M25521 Pain in right elbow: Secondary | ICD-10-CM | POA: Diagnosis not present

## 2023-05-20 DIAGNOSIS — L720 Epidermal cyst: Secondary | ICD-10-CM | POA: Diagnosis not present

## 2023-05-20 DIAGNOSIS — L821 Other seborrheic keratosis: Secondary | ICD-10-CM | POA: Diagnosis not present

## 2023-05-20 DIAGNOSIS — D485 Neoplasm of uncertain behavior of skin: Secondary | ICD-10-CM | POA: Diagnosis not present

## 2023-05-20 DIAGNOSIS — D2261 Melanocytic nevi of right upper limb, including shoulder: Secondary | ICD-10-CM | POA: Diagnosis not present

## 2023-05-20 DIAGNOSIS — L570 Actinic keratosis: Secondary | ICD-10-CM | POA: Diagnosis not present

## 2023-05-20 DIAGNOSIS — L814 Other melanin hyperpigmentation: Secondary | ICD-10-CM | POA: Diagnosis not present

## 2023-06-25 DIAGNOSIS — Z23 Encounter for immunization: Secondary | ICD-10-CM | POA: Diagnosis not present

## 2023-07-05 DIAGNOSIS — L282 Other prurigo: Secondary | ICD-10-CM | POA: Diagnosis not present

## 2023-07-08 DIAGNOSIS — L308 Other specified dermatitis: Secondary | ICD-10-CM | POA: Diagnosis not present

## 2023-08-08 NOTE — Progress Notes (Signed)
 PCP: Larnell Hamilton, MD   Chief Complaint  Patient presents with   Gynecologic Exam    No concerns    HPI:      [name] is a 58 y.o. [id number] who LMP was No LMP recorded. Patient is perimenopausal., presents today for her annual examination.  Her menses are absent due to menopause, LMP 11/20. She does have improved vasomotor sx that are tolerable. No PMB.  Pt having issues with anxiety, insomnia/trouble sleeping, brain fog. Doing nutritional program with exercise/supplements/diet changes with some relief.   Sex activity: single partner, contraception - vasectomy. She does  have vaginal dryness, improved with lubricants.   Last Pap: 10/21/17 Results were: no abnormalities /neg HPV DNA.  Hx of STDs: HSV, takes valtrex  prn sx  Last mammogram: 01/17/23  Results were: normal --routine follow-up in 12 months. Screening breast MRI was normal 12/23. There is a FH of breast cancer in her mom, mat 1/2 sister and pat aunt. There is no FH of ovarian cancer. The patient does do self-breast exams. Pt is MyRisk neg 2017. IBIS=33%/riskscore=47%.  She takes Vit D supp.   Colonoscopy: colonoscopy ~7 years ago without abnormalities.  Repeat due after 10 years.   Tobacco use: The patient denies current or previous tobacco use. Alcohol  use: none  No drug use Exercise: very active  She does get adequate calcium and Vitamin D in her diet.  Labs with PCP.  Past Medical History:  Diagnosis Date   Anxiety    BRCA negative 06/2016   MyRisk neg, MSH6 and RAD51C VUS   Depression    Eczema    Family history of breast cancer    Genital herpes    Increased risk of breast cancer 06/2016   IBIS=33%/riskscore=47.4%   Infertility, female    PMDD (premenstrual dysphoric disorder)    Stage 3 chronic kidney disease (HCC)    Stress incontinence    Vitamin D deficiency     Past Surgical History:  Procedure Laterality Date   CLOSED REDUCTION NASAL FRACTURE Bilateral 04/10/2019   Procedure:  CLOSED REDUCTION NASAL FRACTURE;  Surgeon: Carlie Clark, MD;  Location: [facility];  Service: ENT;  Laterality: Bilateral;   DILATION AND CURETTAGE OF UTERUS     VAGINAL DELIVERY     x2    Family History  Problem Relation Age of Onset   Colon polyps Father    Kidney cancer Father 27   Breast cancer Mother 11   Osteoporosis Mother    Breast cancer Sister 38       mat 1/2 sister, ? BRCA neg   Breast cancer Paternal Aunt 54   Colon cancer Neg Hx    Esophageal cancer Neg Hx    Rectal cancer Neg Hx    Stomach cancer Neg Hx     Social History   Socioeconomic History   Marital status: Married    Spouse name: Not on file   Number of children: Not on file   Years of education: Not on file   Highest education level: Not on file  Occupational History   Not on file  Tobacco Use   Smoking status: Never   Smokeless tobacco: Never  Vaping Use   Vaping status: Never Used  Substance and Sexual Activity   Alcohol  use: No   Drug use: No   Sexual activity: Yes    Birth control/protection: None  Other Topics Concern   Not on file  Social History Narrative  Not on file   Social Drivers of Health   Financial Resource Strain: Not on file  Food Insecurity: Not on file  Transportation Needs: Not on file  Physical Activity: Not on file  Stress: Not on file  Social Connections: Not on file  Intimate Partner Violence: Not on file    Outpatient Medications Prior to Visit  Medication Sig Dispense Refill   cholecalciferol (VITAMIN D) 1000 units tablet Take 1,000 Units by mouth daily.     tretinoin (RETIN-A) 0.05 % cream SMARTSIG:sparingly Topical Every Night     valACYclovir  (VALTREX ) 500 MG tablet TAKE 1 TABLET BY MOUTH TWICE DAILY FOR 3 DAYS AS NEEDED FOR SYMPTOMS 30 tablet 0   Facility-Administered Medications Prior to Visit  Medication Dose Route Frequency Provider Last Rate Last Admin   0.9 %  sodium chloride  infusion  500 mL Intravenous Continuous Nandigam,  Kavitha V, MD       ROS:  Review of Systems  Constitutional:  Negative for fatigue, fever and unexpected weight change.  Respiratory:  Negative for cough, shortness of breath and wheezing.   Cardiovascular:  Negative for chest pain, palpitations and leg swelling.  Gastrointestinal:  Negative for blood in stool, constipation, diarrhea, nausea and vomiting.  Endocrine: Negative for cold intolerance, heat intolerance and polyuria.  Genitourinary:  Negative for dyspareunia, dysuria, flank pain, frequency, genital sores, hematuria, menstrual problem, pelvic pain, urgency, vaginal bleeding, vaginal discharge and vaginal pain.  Musculoskeletal:  Negative for back pain, joint swelling and myalgias.  Skin:  Negative for rash.  Neurological:  Negative for dizziness, syncope, light-headedness, numbness and headaches.  Hematological:  Negative for adenopathy.  Psychiatric/Behavioral:  Negative for agitation, confusion, dysphoric mood, sleep disturbance and suicidal ideas. The patient is not nervous/anxious.   BREAST: No symptoms   Objective: BP 102/62   Pulse 67   Ht 5' 3 (1.6 m)   Wt 155 lb (70.3 kg)   BMI 27.46 kg/m    Physical Exam Constitutional:      Appearance: She is well-developed.  Genitourinary:     Vulva normal.     Right Labia: No rash, tenderness or lesions.    Left Labia: No tenderness, lesions or rash.    No vaginal discharge, erythema or tenderness.      Right Adnexa: not tender and no mass present.    Left Adnexa: not tender and no mass present.    No cervical motion tenderness, friability or polyp.     Uterus is not enlarged or tender.  Breasts:    Right: No mass, nipple discharge, skin change or tenderness.     Left: No mass, nipple discharge, skin change or tenderness.  Neck:     Thyroid : No thyromegaly.  Cardiovascular:     Rate and Rhythm: Normal rate and regular rhythm.     Heart sounds: Normal heart sounds. No murmur heard. Pulmonary:     Effort:  Pulmonary effort is normal.     Breath sounds: Normal breath sounds.  Abdominal:     Palpations: Abdomen is soft.     Tenderness: There is no abdominal tenderness. There is no guarding or rebound.  Musculoskeletal:        General: Normal range of motion.     Cervical back: Normal range of motion.  Lymphadenopathy:     Cervical: No cervical adenopathy.  Neurological:     General: No focal deficit present.     Mental Status: She is alert and oriented to person, place, and time.  Cranial Nerves: No cranial nerve deficit.  Skin:    General: Skin is warm and dry.  Psychiatric:        Mood and Affect: Mood normal.        Behavior: Behavior normal.        Thought Content: Thought content normal.        Judgment: Judgment normal.  Vitals reviewed.     Assessment/Plan: Encounter for annual routine gynecological examination  Cervical cancer screening - Plan: Cytology - PAP  Screening for HPV (human papillomavirus) - Plan: Cytology - PAP  Encounter for screening mammogram for malignant neoplasm of breast - Plan: MM 3D SCREENING MAMMOGRAM BILATERAL BREAST; pt to schedule mammo  Family history of breast cancer - Plan: MM 3D SCREENING MAMMOGRAM BILATERAL BREAST; pt is MyRisk neg.   Increased risk of breast cancer - Plan: MM 3D SCREENING MAMMOGRAM BILATERAL BREAST; aware of recommendations of monthly SBE, yearly CBE and mammos, as well as scr breast MRI. Will call for MRI ref prn.   Herpes genitalis in women - Plan: valACYclovir  (VALTREX ) 500 MG tablet, Rx RF eRxd.    Meds ordered this encounter  Medications   valACYclovir  (VALTREX ) 500 MG tablet    Sig: TAKE 1 TABLET BY MOUTH TWICE DAILY FOR 3 DAYS AS NEEDED FOR SYMPTOMS    Dispense:  30 tablet    Refill:  0    Supervising Provider:   CHERRY, ANIKA [[id number]]          GYN counsel breast self exam, mammography screening, menopause, adequate intake of calcium and vitamin D, diet and exercise    F/U  Return in about 1 year  (around 08/08/2024).  Osborne Serio B. Laban Orourke, PA-C 08/09/2023 9:59 AM

## 2023-08-09 ENCOUNTER — Ambulatory Visit (INDEPENDENT_AMBULATORY_CARE_PROVIDER_SITE_OTHER): Payer: BC Managed Care – PPO | Admitting: Obstetrics and Gynecology

## 2023-08-09 ENCOUNTER — Encounter: Payer: Self-pay | Admitting: Obstetrics and Gynecology

## 2023-08-09 ENCOUNTER — Other Ambulatory Visit (HOSPITAL_COMMUNITY)
Admission: RE | Admit: 2023-08-09 | Discharge: 2023-08-09 | Disposition: A | Discharge status: Home / Self Care | Payer: BC Managed Care – PPO | Source: Ambulatory Visit | Attending: Obstetrics and Gynecology | Admitting: Obstetrics and Gynecology

## 2023-08-09 VITALS — BP 102/62 | HR 67 | Ht 63.0 in | Wt 155.0 lb

## 2023-08-09 DIAGNOSIS — Z01419 Encounter for gynecological examination (general) (routine) without abnormal findings: Secondary | ICD-10-CM | POA: Diagnosis not present

## 2023-08-09 DIAGNOSIS — Z124 Encounter for screening for malignant neoplasm of cervix: Secondary | ICD-10-CM | POA: Insufficient documentation

## 2023-08-09 DIAGNOSIS — Z803 Family history of malignant neoplasm of breast: Secondary | ICD-10-CM

## 2023-08-09 DIAGNOSIS — R928 Other abnormal and inconclusive findings on diagnostic imaging of breast: Secondary | ICD-10-CM

## 2023-08-09 DIAGNOSIS — Z1151 Encounter for screening for human papillomavirus (HPV): Secondary | ICD-10-CM | POA: Diagnosis not present

## 2023-08-09 DIAGNOSIS — Z01411 Encounter for gynecological examination (general) (routine) with abnormal findings: Secondary | ICD-10-CM

## 2023-08-09 DIAGNOSIS — Z1239 Encounter for other screening for malignant neoplasm of breast: Secondary | ICD-10-CM

## 2023-08-09 DIAGNOSIS — Z9189 Other specified personal risk factors, not elsewhere classified: Secondary | ICD-10-CM

## 2023-08-09 DIAGNOSIS — Z1231 Encounter for screening mammogram for malignant neoplasm of breast: Secondary | ICD-10-CM

## 2023-08-09 DIAGNOSIS — Z1272 Encounter for screening for malignant neoplasm of vagina: Secondary | ICD-10-CM

## 2023-08-09 DIAGNOSIS — A6009 Herpesviral infection of other urogenital tract: Secondary | ICD-10-CM

## 2023-08-09 MED ORDER — VALACYCLOVIR HCL 500 MG PO TABS: ORAL_TABLET | ORAL | 0 refills | Status: AC

## 2023-08-09 NOTE — Patient Instructions (Signed)
 I value your feedback and you entrusting [place] with your care. If you get a [place] patient survey, I would appreciate you taking the time to let us know about your experience today. Thank you! ? ? ?

## 2023-08-12 LAB — CYTOLOGY - PAP
Comment: NEGATIVE
Diagnosis: NEGATIVE
High risk HPV: NEGATIVE

## 2023-09-11 DIAGNOSIS — L308 Other specified dermatitis: Secondary | ICD-10-CM | POA: Diagnosis not present

## 2023-09-11 DIAGNOSIS — R21 Rash and other nonspecific skin eruption: Secondary | ICD-10-CM | POA: Diagnosis not present

## 2023-09-24 ENCOUNTER — Encounter: Payer: Self-pay | Admitting: Obstetrics and Gynecology

## 2023-10-14 DIAGNOSIS — H52203 Unspecified astigmatism, bilateral: Secondary | ICD-10-CM | POA: Diagnosis not present

## 2023-10-14 DIAGNOSIS — H04123 Dry eye syndrome of bilateral lacrimal glands: Secondary | ICD-10-CM | POA: Diagnosis not present

## 2023-10-14 DIAGNOSIS — H5203 Hypermetropia, bilateral: Secondary | ICD-10-CM | POA: Diagnosis not present

## 2023-11-27 DIAGNOSIS — H6992 Unspecified Eustachian tube disorder, left ear: Secondary | ICD-10-CM | POA: Diagnosis not present

## 2024-01-29 ENCOUNTER — Ambulatory Visit
Admission: RE | Admit: 2024-01-29 | Discharge: 2024-01-29 | Disposition: A | Discharge status: Home / Self Care | Payer: BC Managed Care – PPO | Source: Ambulatory Visit | Attending: Obstetrics and Gynecology | Admitting: Obstetrics and Gynecology

## 2024-01-29 ENCOUNTER — Encounter: Payer: Self-pay | Admitting: Obstetrics and Gynecology

## 2024-01-29 DIAGNOSIS — Z1231 Encounter for screening mammogram for malignant neoplasm of breast: Secondary | ICD-10-CM

## 2024-01-29 DIAGNOSIS — Z9189 Other specified personal risk factors, not elsewhere classified: Secondary | ICD-10-CM

## 2024-01-29 DIAGNOSIS — Z803 Family history of malignant neoplasm of breast: Secondary | ICD-10-CM

## 2024-02-03 ENCOUNTER — Ambulatory Visit: Payer: Self-pay | Admitting: Obstetrics and Gynecology

## 2024-02-03 NOTE — Addendum Note (Signed)
 Addended by: WATT HILA B on: 02/03/2024 02:57 PM   Modules accepted: Orders

## 2024-02-03 NOTE — Telephone Encounter (Signed)
 MRI order

## 2024-05-06 ENCOUNTER — Encounter: Payer: Self-pay | Admitting: Obstetrics and Gynecology

## 2024-06-02 DIAGNOSIS — D72819 Decreased white blood cell count, unspecified: Secondary | ICD-10-CM | POA: Diagnosis not present

## 2024-06-02 DIAGNOSIS — N1831 Chronic kidney disease, stage 3a: Secondary | ICD-10-CM | POA: Diagnosis not present

## 2024-06-08 DIAGNOSIS — N1831 Chronic kidney disease, stage 3a: Secondary | ICD-10-CM | POA: Diagnosis not present

## 2024-06-08 DIAGNOSIS — Z0000 Encounter for general adult medical examination without abnormal findings: Secondary | ICD-10-CM | POA: Diagnosis not present

## 2024-06-08 DIAGNOSIS — Z23 Encounter for immunization: Secondary | ICD-10-CM | POA: Diagnosis not present

## 2024-06-08 DIAGNOSIS — Z1331 Encounter for screening for depression: Secondary | ICD-10-CM | POA: Diagnosis not present

## 2024-06-08 DIAGNOSIS — Z1339 Encounter for screening examination for other mental health and behavioral disorders: Secondary | ICD-10-CM | POA: Diagnosis not present

## 2024-06-17 DIAGNOSIS — D225 Melanocytic nevi of trunk: Secondary | ICD-10-CM | POA: Diagnosis not present

## 2024-06-17 DIAGNOSIS — L821 Other seborrheic keratosis: Secondary | ICD-10-CM | POA: Diagnosis not present

## 2024-06-17 DIAGNOSIS — L814 Other melanin hyperpigmentation: Secondary | ICD-10-CM | POA: Diagnosis not present

## 2024-06-30 ENCOUNTER — Ambulatory Visit
Admission: RE | Admit: 2024-06-30 | Discharge: 2024-06-30 | Disposition: A | Source: Ambulatory Visit | Attending: Obstetrics and Gynecology

## 2024-06-30 DIAGNOSIS — Z9189 Other specified personal risk factors, not elsewhere classified: Secondary | ICD-10-CM

## 2024-06-30 DIAGNOSIS — Z803 Family history of malignant neoplasm of breast: Secondary | ICD-10-CM

## 2024-06-30 DIAGNOSIS — Z1239 Encounter for other screening for malignant neoplasm of breast: Secondary | ICD-10-CM

## 2024-06-30 MED ORDER — GADOPICLENOL 0.5 MMOL/ML IV SOLN
7.5000 mL | Freq: Once | INTRAVENOUS | Status: AC | PRN
  Administered 2024-06-30: 7.5 mL via INTRAVENOUS

## 2024-07-01 ENCOUNTER — Encounter: Payer: Self-pay | Admitting: Obstetrics and Gynecology

## 2024-07-01 NOTE — Addendum Note (Signed)
 Addended by: WATT HILA B on: 07/01/2024 08:36 AM   Modules accepted: Orders

## 2024-12-23 ENCOUNTER — Other Ambulatory Visit
